# Patient Record
Sex: Female | Born: 1935 | Race: Black or African American | Hispanic: No | Smoking: Never smoker
Health system: Southern US, Community
[De-identification: ages and names within clinical notes are randomized; demographics above are authoritative.]

## PROBLEM LIST (undated history)

## (undated) DIAGNOSIS — E559 Vitamin D deficiency, unspecified: Secondary | ICD-10-CM

## (undated) DIAGNOSIS — I452 Bifascicular block: Secondary | ICD-10-CM

## (undated) DIAGNOSIS — R232 Flushing: Secondary | ICD-10-CM

## (undated) DIAGNOSIS — E785 Hyperlipidemia, unspecified: Secondary | ICD-10-CM

## (undated) DIAGNOSIS — R42 Dizziness and giddiness: Secondary | ICD-10-CM

## (undated) DIAGNOSIS — M8580 Other specified disorders of bone density and structure, unspecified site: Secondary | ICD-10-CM

## (undated) DIAGNOSIS — H269 Unspecified cataract: Secondary | ICD-10-CM

## (undated) DIAGNOSIS — E079 Disorder of thyroid, unspecified: Secondary | ICD-10-CM

## (undated) DIAGNOSIS — I10 Essential (primary) hypertension: Secondary | ICD-10-CM

## (undated) DIAGNOSIS — R918 Other nonspecific abnormal finding of lung field: Secondary | ICD-10-CM

## (undated) DIAGNOSIS — R7309 Other abnormal glucose: Secondary | ICD-10-CM

## (undated) DIAGNOSIS — I351 Nonrheumatic aortic (valve) insufficiency: Secondary | ICD-10-CM

## (undated) DIAGNOSIS — R002 Palpitations: Secondary | ICD-10-CM

## (undated) HISTORY — DX: Flushing: R23.2

## (undated) HISTORY — DX: Vitamin D deficiency, unspecified: E55.9

## (undated) HISTORY — DX: Other abnormal glucose: R73.09

## (undated) HISTORY — DX: Other specified disorders of bone density and structure, unspecified site: M85.80

## (undated) HISTORY — DX: Unspecified cataract: H26.9

## (undated) HISTORY — DX: Other nonspecific abnormal finding of lung field: R91.8

## (undated) HISTORY — DX: Palpitations: R00.2

## (undated) HISTORY — DX: Disorder of thyroid, unspecified: E07.9

## (undated) HISTORY — PX: ABDOMINAL HYSTERECTOMY: SHX81

## (undated) HISTORY — DX: Hyperlipidemia, unspecified: E78.5

## (undated) HISTORY — DX: Dizziness and giddiness: R42

---
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
## 1973-09-14 HISTORY — PX: APPENDECTOMY: SHX54

## 2001-08-14 ENCOUNTER — Emergency Department (HOSPITAL_COMMUNITY): Admission: EM | Admit: 2001-08-14 | Discharge: 2001-08-14 | Payer: Self-pay | Admitting: *Deleted

## 2001-09-14 HISTORY — PX: CHOLECYSTECTOMY: SHX55

## 2003-11-26 ENCOUNTER — Ambulatory Visit (HOSPITAL_COMMUNITY): Admission: RE | Admit: 2003-11-26 | Discharge: 2003-11-26 | Payer: Self-pay | Admitting: Gastroenterology

## 2008-02-09 ENCOUNTER — Emergency Department (HOSPITAL_COMMUNITY): Admission: EM | Admit: 2008-02-09 | Discharge: 2008-02-09 | Payer: Self-pay | Admitting: Emergency Medicine

## 2008-09-14 HISTORY — PX: CATARACT EXTRACTION, BILATERAL: SHX1313

## 2009-05-14 ENCOUNTER — Emergency Department (HOSPITAL_COMMUNITY): Admission: EM | Admit: 2009-05-14 | Discharge: 2009-05-14 | Payer: Self-pay | Admitting: Emergency Medicine

## 2009-07-17 ENCOUNTER — Encounter: Admission: RE | Admit: 2009-07-17 | Discharge: 2009-07-17 | Payer: Self-pay | Admitting: Internal Medicine

## 2009-07-21 ENCOUNTER — Emergency Department (HOSPITAL_COMMUNITY): Admission: EM | Admit: 2009-07-21 | Discharge: 2009-07-21 | Payer: Self-pay | Admitting: Emergency Medicine

## 2009-11-11 ENCOUNTER — Encounter: Admission: RE | Admit: 2009-11-11 | Discharge: 2009-12-05 | Payer: Self-pay | Admitting: Internal Medicine

## 2010-09-30 IMAGING — CT CT HEAD W/O CM
1 of 2 series · 13 of 30 positions shown, 17 images · non-contrast
Comparison: None.

CLINICAL DATA: Dizziness, nausea, generalized weakness, headache

CT HEAD WITHOUT CONTRAST
TECHNIQUE: Contiguous axial images were obtained from the base of
the skull through the vertex without contrast.

[Series 2: brain · axial · 0.47mm/px · z∈[+202,+331]mm · 13 of 28 slices shown, 17 images]
[im 2/28  brain]
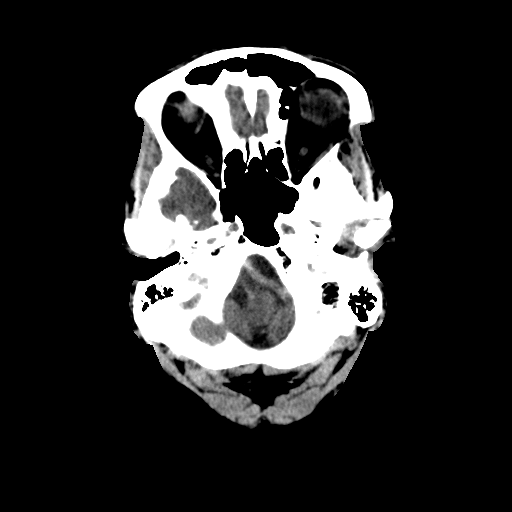
[im 2/28  bone]
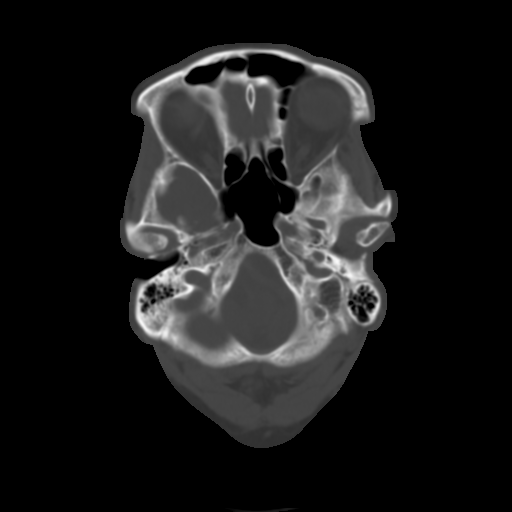
[im 4/28  brain]
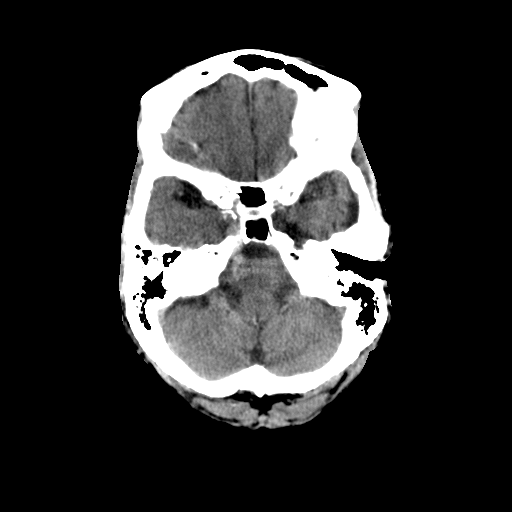
[im 6/28  brain]
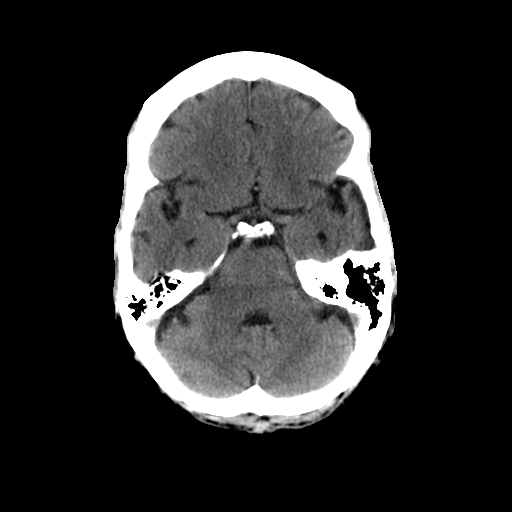
[im 8/28  brain]
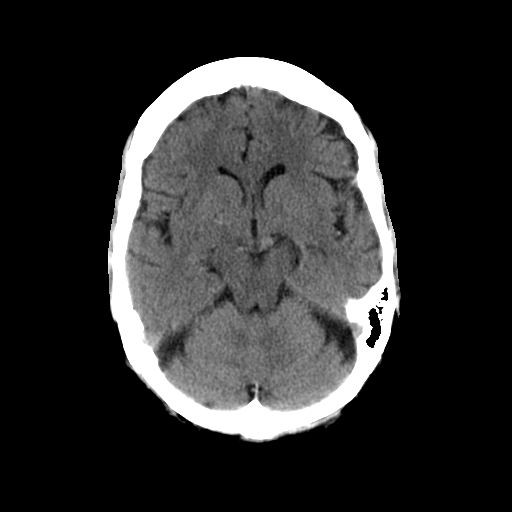
[im 10/28  brain]
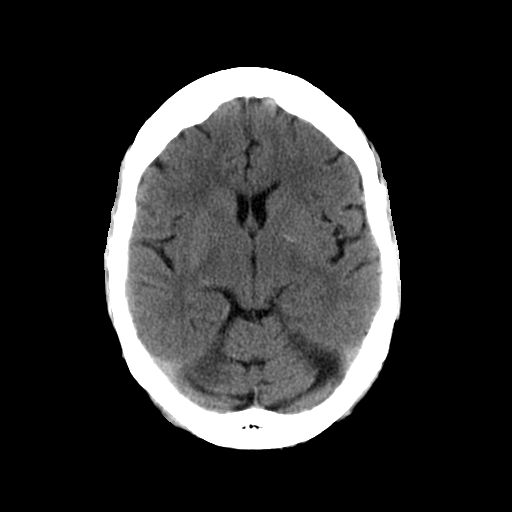
[im 10/28  bone]
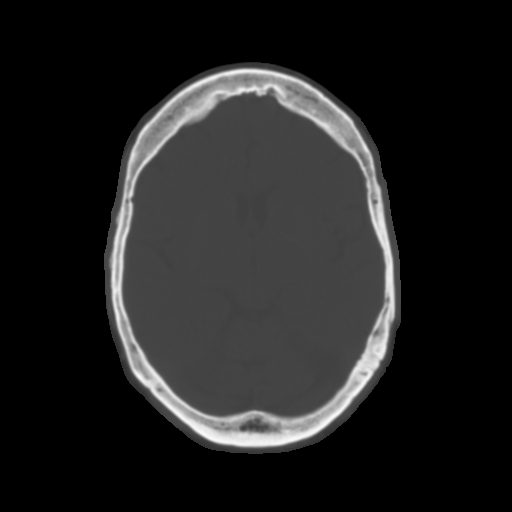
[im 12/28  brain]
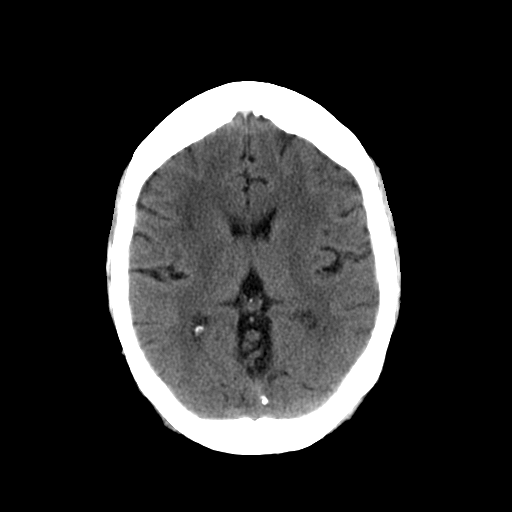
[im 14/28  brain]
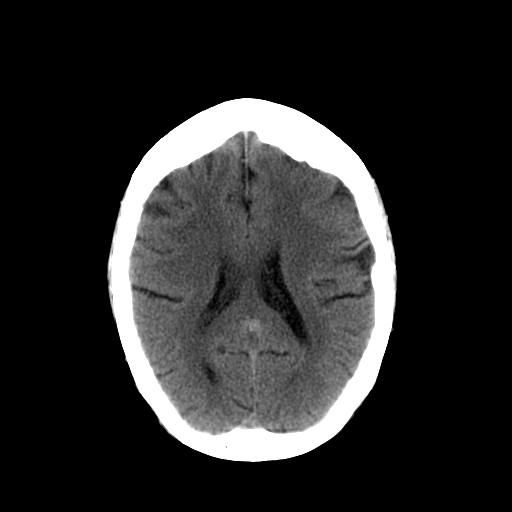
[im 16/28  brain]
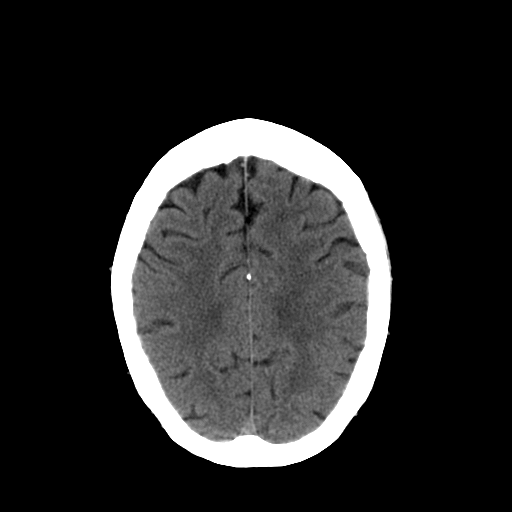
[im 18/28  brain]
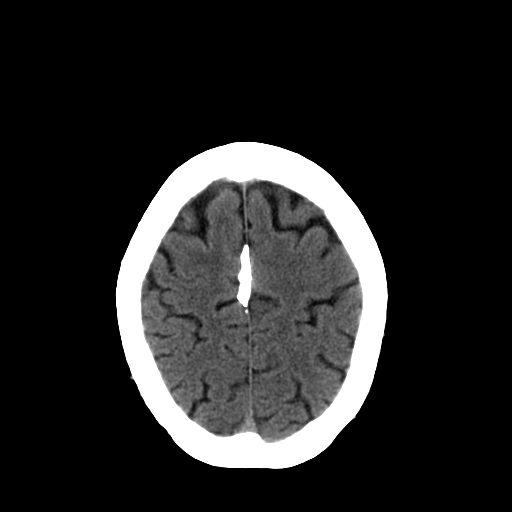
[im 18/28  bone]
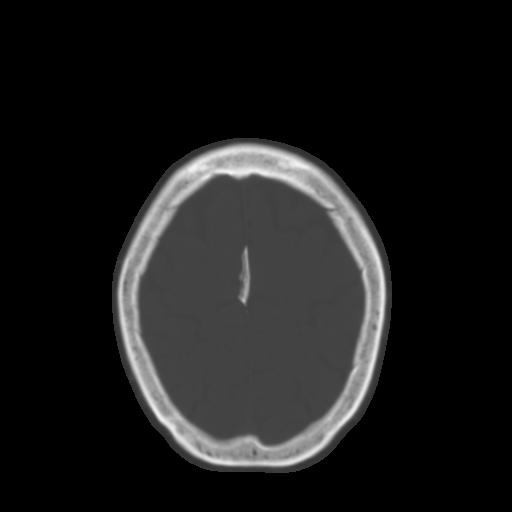
[im 20/28  brain]
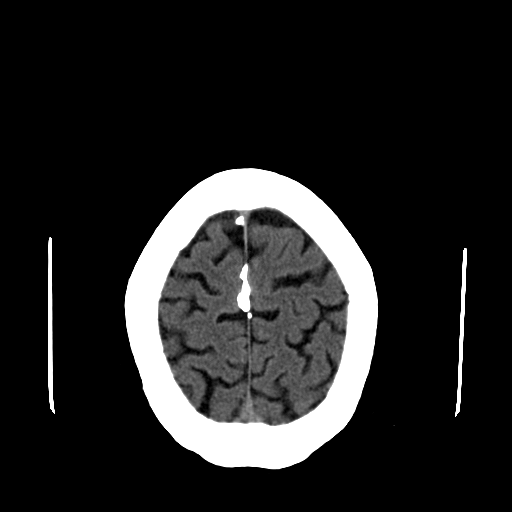
[im 22/28  brain]
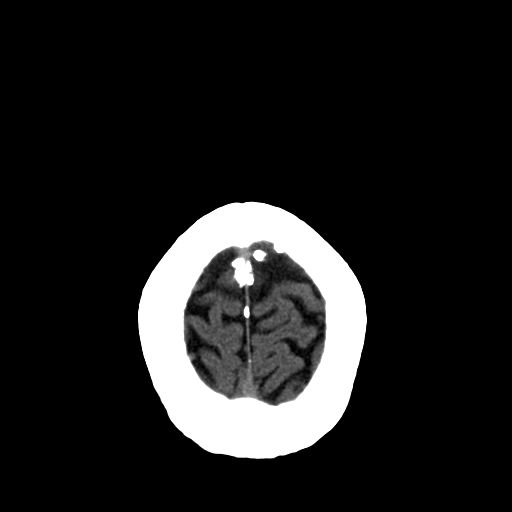
[im 24/28  brain]
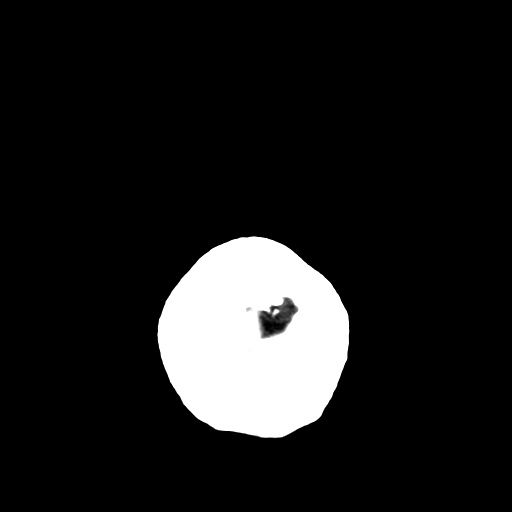
[im 26/28  brain]
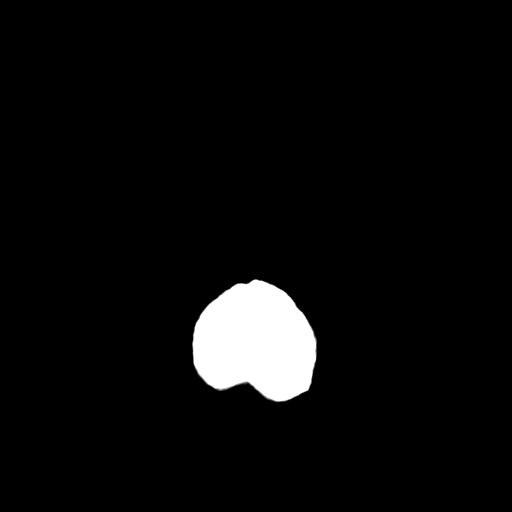
[im 26/28  bone]
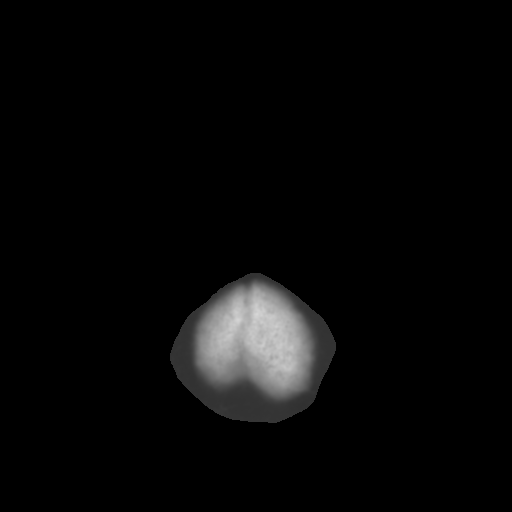

[13 of 30 positions shown; findings below may reference images not displayed]

FINDINGS: There is diffuse brain atrophy with microvascular
ischemic changes in the subcortical and periventricular white
matter.  No acute intracranial hemorrhage, mass lesion, definite
infarction, focal edema, hydrocephalus, midline shift, herniation,
or extra-axial fluid collection.  The gray and white matter
differentiation is maintained.  Cisterns patent.  Atherosclerosis
of the intracranial vessels.  Vertebral basilar system is markedly
tortuous.  Mastoids and sinuses visualized are clear.
IMPRESSION: Atrophy and microvascular ischemic changes.
No acute finding by noncontrast CT.

## 2010-12-20 LAB — DIFFERENTIAL
Basophils Absolute: 0 10*3/uL (ref 0.0–0.1)
Lymphocytes Relative: 32 % (ref 12–46)
Lymphs Abs: 2.3 10*3/uL (ref 0.7–4.0)
Neutro Abs: 4.1 10*3/uL (ref 1.7–7.7)
Neutrophils Relative %: 58 % (ref 43–77)

## 2010-12-20 LAB — CBC
Platelets: 153 10*3/uL (ref 150–400)
RDW: 14.3 % (ref 11.5–15.5)
WBC: 7.1 10*3/uL (ref 4.0–10.5)

## 2010-12-20 LAB — POCT I-STAT, CHEM 8
BUN: 20 mg/dL (ref 6–23)
Creatinine, Ser: 0.8 mg/dL (ref 0.4–1.2)
Hemoglobin: 17.3 g/dL — ABNORMAL HIGH (ref 12.0–15.0)
Potassium: 3.3 mEq/L — ABNORMAL LOW (ref 3.5–5.1)
Sodium: 142 mEq/L (ref 135–145)

## 2011-01-30 NOTE — Op Note (Signed)
Sonya Gross, Sonya Gross                          ACCOUNT NO.:  [id number]   MEDICAL RECORD NO.:  [id number]                   PATIENT TYPE:  AMB   LOCATION:  ENDO                                 FACILITY:  [facility]   PHYSICIAN:  Anselmo Rod, M.D.               DATE OF BIRTH:  Apr 21, 1930   DATE OF PROCEDURE:  11/26/2003  DATE OF DISCHARGE:                                 OPERATIVE REPORT   PROCEDURE PERFORMED:  Screening colonoscopy.   ENDOSCOPIST:  Anselmo Rod, M.D.   INSTRUMENT USED:  Olympus video colonoscope.   INDICATION FOR PROCEDURE:  A 77 year old African-American female undergoing  screening colonoscopy to rule out colonic polyps, masses, etc.   PREPROCEDURE PREPARATION:  Informed consent was procured from the patient.  The patient was fasted for eight hours prior to the procedure and prepped  with a bottle of magnesium citrate and a gallon of GoLYTELY the night prior  to the procedure.   PREPROCEDURE PHYSICAL:  VITAL SIGNS:  The patient had stable vital signs.  NECK:  Supple.  CHEST:  Clear to auscultation.  S1, S2 regular.  ABDOMEN:  Soft with normal bowel sounds.   DESCRIPTION OF PROCEDURE:  The patient was placed in the left lateral  decubitus position and sedated with 70 mg of Demerol and 6 mg of Versed  intravenously.  Once the patient was adequately sedate and maintained on low-  flow oxygen and continuous cardiac monitoring, the Olympus video colonoscope  was advanced from the rectum to the cecum.  The appendiceal orifice and  ileocecal valve were clearly visualized and photographed.  A few scattered  diverticula were seen.  Small internal hemorrhoids were appreciated on  retroflexion in the rectum.  The patient tolerated the procedure well  without complications.   IMPRESSION:  1. Small internal hemorrhoids.  2. Few scattered diverticula.  3. No masses or polyps seen.   RECOMMENDATIONS:  1. A high-fiber diet has been recommended with liberal fluid  intake.  2. Repeat CRC screening in the next 10 years unless the patient develops any     abnormal symptoms in the interim.  3. Outpatient follow-up as the need arises in the future.                                               Anselmo Rod, M.D.    JNM/MEDQ  D:  11/26/2003  T:  11/26/2003  Job:  [id number]   cc:   Merlene Laughter. Renae Gloss, M.D.  [address]  [address]  [place]  [place] [address]  Fax: [phone]

## 2011-06-10 LAB — COMPREHENSIVE METABOLIC PANEL
ALT: 28
AST: 23
CO2: 28
Calcium: 9.5
Chloride: 100
GFR calc Af Amer: >60
GFR calc non Af Amer: >60
Glucose, Bld: 154 — ABNORMAL HIGH
Sodium: 138
Total Bilirubin: 0.7

## 2011-06-10 LAB — CBC
Hemoglobin: 14
MCHC: 33.7
MCV: 88.5
RBC: 4.69
WBC: 7

## 2011-06-10 LAB — DIFFERENTIAL
Basophils Absolute: 0
Basophils Relative: 0
Eosinophils Absolute: 0.1
Eosinophils Relative: 1
Lymphs Abs: 0.9
Neutrophils Relative %: 76

## 2013-09-15 ENCOUNTER — Ambulatory Visit (INDEPENDENT_AMBULATORY_CARE_PROVIDER_SITE_OTHER): Payer: Medicare PPO | Admitting: Family Medicine

## 2013-09-15 ENCOUNTER — Ambulatory Visit: Payer: BC Managed Care – PPO

## 2013-09-15 VITALS — BP 124/70 | HR 89 | Temp 98.2°F | Resp 18 | Ht 64.5 in | Wt 146.0 lb

## 2013-09-15 DIAGNOSIS — R9389 Abnormal findings on diagnostic imaging of other specified body structures: Secondary | ICD-10-CM

## 2013-09-15 DIAGNOSIS — R6883 Chills (without fever): Secondary | ICD-10-CM

## 2013-09-15 DIAGNOSIS — R05 Cough: Secondary | ICD-10-CM

## 2013-09-15 DIAGNOSIS — R059 Cough, unspecified: Secondary | ICD-10-CM

## 2013-09-15 MED ORDER — DOXYCYCLINE HYCLATE 100 MG PO TABS: 100.0000 mg | ORAL_TABLET | Freq: Two times a day (BID) | ORAL | Status: DC

## 2013-09-15 MED ORDER — BENZONATATE 100 MG PO CAPS: 100.0000 mg | ORAL_CAPSULE | Freq: Three times a day (TID) | ORAL | Status: DC | PRN

## 2013-09-15 NOTE — Patient Instructions (Signed)
Your chest x-ray looks ok to me.  We are going to treat you for bronchitis with an antibiotic called doxycycline and tessalon perles (as needed for cough).    Please let me know if you are not feeling better in the next few days- Sooner if worse.

## 2013-09-15 NOTE — Progress Notes (Addendum)
[facility] [address], [place] [id number] [phone]  Date:  09/15/2013   Name:  Sonya Gross   DOB:  Apr 05, 1930   MRN:  [id number]  PCP:  No primary provider on file.    Chief Complaint: Cough and Chills   History of Present Illness:  Sonya Gross is a 80 y.o. very pleasant female patient who presents with the following:  Pt is complaining of a chronic productive cough with mild phlegm that has been progressively worsening for the past 1 week ago. She is also complaining of associated sinus pressure. Pt reports having no sick contacts. She denies emesis, diarrhea, nausea, abdominal pain, fatigue, and fever. Otherwise pt is generally a healthy woman. She is treated for HTN and high cholesterol per her PCP Dr. Renae GlossShelton  Here today with her husband  There are no active problems to display for this patient.   Past Medical History  Diagnosis Date   Cataract    Hyperlipidemia     Past Surgical History  Procedure Laterality Date   Abdominal hysterectomy      History  Substance Use Topics   Smoking status: Never Smoker    Smokeless tobacco: Not on file   Alcohol Use: Not on file    Family History  Problem Relation Age of Onset   Cancer Mother    Mental illness Sister     Allergies  Allergen Reactions   Penicillins     Medication list has been reviewed and updated.  No current outpatient prescriptions on file prior to visit.   No current facility-administered medications on file prior to visit.    Review of Systems:  As per HPI, otherwise negative.  Physical Examination: Filed Vitals:   09/15/13 1142  BP: 124/70  Pulse: 89  Temp: 98.2 F (36.8 C)  Resp: 18    Body mass index is 24.68 kg/(m^2).   GEN: WDWN, NAD, Non-toxic, A & O x 3. Pt looks well. Spry for her age HEENT: Atraumatic, Normocephalic. Neck supple. No masses, No LAD.  Bilateral TM wnl, oropharynx normal.  PEERL,EOMI.   Ears and Nose: No  external deformity. CV: RRR, No M/G/R. No JVD. No thrill. No extra heart sounds. PULM: CTA B, no wheezes, crackles, rhonchi. No retractions. No resp. distress. No accessory muscle use. ABD: S, NT, ND EXTR: No c/c/e NEURO Normal gait.  PSYCH: Normally interactive. Conversant. Not depressed or anxious appearing.  Calm demeanor.   Results for orders placed during the hospital encounter of 05/14/09  CBC      Result Value Range   WBC 7.1  4.0 - 10.5 K/uL   RBC 4.71  3.87 - 5.11 MIL/uL   Hemoglobin 14.4  12.0 - 15.0 g/dL   HCT 29.542.7  62.136.0 - 30.846.0 %   MCV 90.7  78.0 - 100.0 fL   MCHC 33.8  30.0 - 36.0 g/dL   RDW 65.714.3  84.611.5 - 96.215.5 %   Platelets 153  150 - 400 K/uL  DIFFERENTIAL      Result Value Range   Neutrophils Relative % 58  43 - 77 %   Neutro Abs 4.1  1.7 - 7.7 K/uL   Lymphocytes Relative 32  12 - 46 %   Lymphs Abs 2.3  0.7 - 4.0 K/uL   Monocytes Relative 8  3 - 12 %   Monocytes Absolute 0.5  0.1 - 1.0 K/uL   Eosinophils Relative 2  0 - 5 %  Eosinophils Absolute 0.1  0.0 - 0.7 K/uL   Basophils Relative 1  0 - 1 %   Basophils Absolute 0.0  0.0 - 0.1 K/uL  POCT I-STAT, CHEM 8      Result Value Range   Sodium 142  135 - 145 mEq/L   Potassium 3.3 (*) 3.5 - 5.1 mEq/L   Chloride 108  96 - 112 mEq/L   BUN 20  6 - 23 mg/dL   Creatinine, Ser 0.8  0.4 - 1.2 mg/dL   Glucose, Bld 161 (*) 70 - 99 mg/dL   Calcium, Ion 0.96 (*) 1.12 - 1.32 mmol/L   TCO2 24  0 - 100 mmol/L   Hemoglobin 17.3 (*) 12.0 - 15.0 g/dL   HCT 04.5 (*) 40.9 - 81.1 %   [facility] reading (PRIMARY) by  Dr. Patsy Lager. CXR: negative  Assessment and Plan: Cough - Plan: DG Chest 2 View, doxycycline (VIBRA-TABS) 100 MG tablet, benzonatate (TESSALON) 100 MG capsule  Chills  Treat for bronchitis as above.  Asked her to please let me know it not better soon- Sooner if worse.    Signed Abbe Amsterdam, MD

## 2013-09-18 NOTE — Progress Notes (Unsigned)
Pt has been called regarding her recent CXR and is ok with doing a CT for follow up of possible nodule.  Will order for her  CHEST 2 VIEW  COMPARISON: 05/14/2009  FINDINGS:  There is a 7.9 mm right upper lobe nodular opacity. There is no  focal consolidation, pleural effusion or pneumothorax. Normal  cardiomediastinal silhouette. There is thoracic spine spondylosis.  IMPRESSION:  7.9 mm right upper lobe nodular opacity which may represent a  dilated vessel versus a true pulmonary nodule. Recommend further  evaluation with a dedicated CT of the chest.

## 2013-09-19 ENCOUNTER — Telehealth: Payer: Self-pay | Admitting: Radiology

## 2013-09-19 NOTE — Telephone Encounter (Signed)
Message copied by Caffie DammeLITTRELL, AMY W on Tue Sep 19, 2013 10:10 AM ------      Message from: Abbe AmsterdamOPLAND, JESSICA C      Created: Mon Sep 18, 2013  8:57 PM       I ordered the CT chest (with contrast) as recommended by radiology.  Can we please get this scheduled for her?        Thanks!      She may also need BUN/Creat prior ------

## 2013-09-19 NOTE — Telephone Encounter (Signed)
I am working on this, but it will take up to 7days for the authorization. This patient has [employer] medicare and all referrals / studies/ orders everything has to be faxed to [employer] insurance company for authorization.

## 2013-10-11 ENCOUNTER — Ambulatory Visit
Admission: RE | Admit: 2013-10-11 | Discharge: 2013-10-11 | Disposition: A | Discharge status: Home / Self Care | Payer: Medicare PPO | Source: Ambulatory Visit | Attending: Family Medicine | Admitting: Family Medicine

## 2013-10-11 DIAGNOSIS — R059 Cough, unspecified: Secondary | ICD-10-CM

## 2013-10-11 DIAGNOSIS — R05 Cough: Secondary | ICD-10-CM

## 2013-10-11 DIAGNOSIS — R9389 Abnormal findings on diagnostic imaging of other specified body structures: Secondary | ICD-10-CM

## 2013-10-11 MED ORDER — IOHEXOL 300 MG/ML  SOLN
75.0000 mL | Freq: Once | INTRAMUSCULAR | Status: AC | PRN
  Administered 2013-10-11: 75 mL via INTRAVENOUS

## 2013-10-13 ENCOUNTER — Telehealth: Payer: Self-pay | Admitting: Family Medicine

## 2013-10-13 ENCOUNTER — Encounter: Payer: Self-pay | Admitting: Family Medicine

## 2013-10-13 DIAGNOSIS — R059 Cough, unspecified: Secondary | ICD-10-CM

## 2013-10-13 DIAGNOSIS — R05 Cough: Secondary | ICD-10-CM

## 2013-10-13 DIAGNOSIS — R911 Solitary pulmonary nodule: Secondary | ICD-10-CM

## 2013-10-13 MED ORDER — BENZONATATE 100 MG PO CAPS: 100.0000 mg | ORAL_CAPSULE | Freq: Three times a day (TID) | ORAL | Status: DC | PRN

## 2013-10-13 NOTE — Telephone Encounter (Signed)
Called to go over her chest CT.  There are a few nodules, none of which necessitate a PET scan but follow-up is recommended.  She reports that she has never smoked or been exposed to any chemicals such as asbestos or silicone as far as she knows.  Therefore recommended that we recheck a CT scan in about 6 months.  I will order this CT for her.  She also requests a refill of tessalon perles which is fine  CT CHEST WITH CONTRAST  TECHNIQUE:  Multidetector CT imaging of the chest was performed during  intravenous contrast administration.  CONTRAST: 75mL OMNIPAQUE IOHEXOL 300 MG/ML SOLN  COMPARISON: Chest x-ray 09/15/2013  FINDINGS:  There is a nodular density noted posteriorly along the major fissure  within the right upper lobe. This measures 8 mm and has a slightly  spiculated appearance and appears to cause mild retraction of the  major fissure. Multiple other right lung nodules. 6 mm nodule in the  right lower lobe on image 31. 5 mm nodule in the right middle lobe  on image 36. Sub pleural 7 mm nodule in the right middle lobe on  image 40. Flat 8 mm nodule along the minor fissure in the right  middle lobe on image 31. 5 mm nodule in the left lower lobe on image  40 for along with an 8 mm left lower lobe nodule on image 34. Also  in the left lung base just above the left hemidiaphragm is an 8 mm  nodule on image 44.  Areas of biapical scarring noted. No pleural effusions.  Heart is normal size. Aorta is normal caliber. No mediastinal,  hilar, or axillary adenopathy. Chest wall soft tissues are  unremarkable.  Degenerative changes throughout the thoracic spine. No acute bony  abnormality. Imaging into the upper abdomen shows no acute findings.  Prior cholecystectomy.  IMPRESSION:  There are multiple bilateral pulmonary nodules. There is an 8 mm  slightly spiculated nodule in the right upper lobe along the major  fissure which likely corresponds to the density seen on chest x-ray.  The  largest of these nodules (8 mm bilaterally) are borderline large  enough to evaluate with PET CT. The others are too small to evaluate  with PET CT. Therefore, I would recommend close CT surveillance. If  the patient is at high risk for bronchogenic carcinoma, follow-up  chest CT at 3-636months is recommended. If the patient is at low risk  for bronchogenic carcinoma, follow-up chest CT at 6-12 months is  recommended. This recommendation follows the consensus statement:  Guidelines for Management of Small Pulmonary Nodules Detected on CT  Scans: A Statement from the Fleischner Society as published in  Radiology 2005; 237:395-400.

## 2014-02-27 ENCOUNTER — Other Ambulatory Visit: Payer: Self-pay | Admitting: Internal Medicine

## 2014-02-27 DIAGNOSIS — R918 Other nonspecific abnormal finding of lung field: Secondary | ICD-10-CM

## 2014-03-02 ENCOUNTER — Ambulatory Visit
Admission: RE | Admit: 2014-03-02 | Discharge: 2014-03-02 | Disposition: A | Discharge status: Home / Self Care | Payer: Medicare PPO | Source: Ambulatory Visit | Attending: Internal Medicine | Admitting: Internal Medicine

## 2014-03-02 DIAGNOSIS — R918 Other nonspecific abnormal finding of lung field: Secondary | ICD-10-CM

## 2014-03-02 MED ORDER — IOHEXOL 300 MG/ML  SOLN
75.0000 mL | Freq: Once | INTRAMUSCULAR | Status: AC | PRN
  Administered 2014-03-02: 75 mL via INTRAVENOUS

## 2014-10-12 ENCOUNTER — Telehealth: Payer: Self-pay | Admitting: Radiology

## 2014-10-12 DIAGNOSIS — R9389 Abnormal findings on diagnostic imaging of other specified body structures: Secondary | ICD-10-CM

## 2014-10-12 NOTE — Telephone Encounter (Signed)
Dr Patsy Lageropland- patient is going in for her follow up chest CT on Monday.  [facility] needs a BUN/Cr.  May I order this and request patient come in for lab only?

## 2014-10-12 NOTE — Telephone Encounter (Signed)
Sure, please do.  Thank you!

## 2014-10-13 NOTE — Telephone Encounter (Signed)
LMOM to CB BUN/Cr future ordered.

## 2014-10-15 ENCOUNTER — Inpatient Hospital Stay: Admission: RE | Admit: 2014-10-15 | Payer: Medicare PPO | Source: Ambulatory Visit

## 2014-10-15 NOTE — Telephone Encounter (Signed)
Relayed message to patient.  She will come in for the labs.

## 2014-10-16 ENCOUNTER — Other Ambulatory Visit: Payer: Self-pay | Admitting: Family Medicine

## 2014-10-16 NOTE — Telephone Encounter (Signed)
[facility] lab called. Pt is there to get her blood drawn. Faxed over order. Will call [facility] to reschedule CT

## 2014-10-17 ENCOUNTER — Other Ambulatory Visit: Payer: Self-pay | Admitting: Family Medicine

## 2014-10-17 ENCOUNTER — Ambulatory Visit
Admission: RE | Admit: 2014-10-17 | Discharge: 2014-10-17 | Disposition: A | Discharge status: Home / Self Care | Payer: Medicare PPO | Source: Ambulatory Visit | Attending: Family Medicine | Admitting: Family Medicine

## 2014-10-17 DIAGNOSIS — R911 Solitary pulmonary nodule: Secondary | ICD-10-CM

## 2014-10-18 ENCOUNTER — Telehealth: Payer: Self-pay | Admitting: Family Medicine

## 2014-10-18 DIAGNOSIS — R918 Other nonspecific abnormal finding of lung field: Secondary | ICD-10-CM

## 2014-10-18 LAB — COMPREHENSIVE METABOLIC PANEL
ALK PHOS: 56 U/L (ref 39–117)
ALT: 13 U/L (ref 0–35)
AST: 16 U/L (ref 0–37)
Albumin: 3.9 g/dL (ref 3.5–5.2)
BUN: 14 mg/dL (ref 6–23)
CHLORIDE: 104 meq/L (ref 96–112)
CO2: 29 mEq/L (ref 19–32)
Calcium: 10.3 mg/dL (ref 8.4–10.5)
Creat: 0.78 mg/dL (ref 0.50–1.10)
GLUCOSE: 104 mg/dL — AB (ref 70–99)
Potassium: 4.5 mEq/L (ref 3.5–5.3)
Sodium: 142 mEq/L (ref 135–145)
TOTAL PROTEIN: 7 g/dL (ref 6.0–8.3)
Total Bilirubin: 0.6 mg/dL (ref 0.2–1.2)

## 2014-10-18 NOTE — Telephone Encounter (Signed)
Called but phone went to machine and then disconnected.  Will send her a letter, order CT for one year from now.

## 2014-10-19 ENCOUNTER — Encounter: Payer: Self-pay | Admitting: Radiology

## 2015-05-15 ENCOUNTER — Ambulatory Visit (INDEPENDENT_AMBULATORY_CARE_PROVIDER_SITE_OTHER): Payer: Medicare PPO | Admitting: Physician Assistant

## 2015-05-15 VITALS — BP 124/64 | HR 89 | Temp 98.9°F | Resp 14 | Ht 65.0 in | Wt 135.8 lb

## 2015-05-15 DIAGNOSIS — R300 Dysuria: Secondary | ICD-10-CM

## 2015-05-15 DIAGNOSIS — N3001 Acute cystitis with hematuria: Secondary | ICD-10-CM

## 2015-05-15 LAB — POCT UA - MICROSCOPIC ONLY
CASTS, UR, LPF, POC: NEGATIVE
Crystals, Ur, HPF, POC: NEGATIVE
MUCUS UA: POSITIVE
Yeast, UA: NEGATIVE

## 2015-05-15 LAB — POCT URINALYSIS DIPSTICK
BILIRUBIN UA: NEGATIVE
GLUCOSE UA: NEGATIVE
Ketones, UA: NEGATIVE
NITRITE UA: POSITIVE
Protein, UA: >=300
Spec Grav, UA: 1.025
Urobilinogen, UA: 0.2
pH, UA: 5.5

## 2015-05-15 MED ORDER — NITROFURANTOIN MONOHYD MACRO 100 MG PO CAPS: 100.0000 mg | ORAL_CAPSULE | Freq: Two times a day (BID) | ORAL | Status: DC

## 2015-05-15 NOTE — Progress Notes (Signed)
   Subjective:    Patient ID: Sonya Gross, female    DOB: 1930-01-17, 81 y.o.   MRN: [id number]  HPI Patient presents for dysuria that has been present for 1 day along with frequency. This morning had hematuria and lower abdominal pain. Denies fever, chills, N/V. Is not sexually active. Bowel movements unchanged. Med allergy PCN.   Review of Systems  Constitutional: Negative.  Negative for fever and chills.  Gastrointestinal: Negative for nausea, vomiting, abdominal pain, diarrhea and constipation.  Genitourinary: Positive for dysuria, urgency, frequency, hematuria and pelvic pain. Negative for flank pain, decreased urine volume, vaginal bleeding, vaginal discharge, difficulty urinating and vaginal pain.  Musculoskeletal: Negative for back pain.       Objective:   Physical Exam  Constitutional: She is oriented to person, place, and time. She appears well-developed and well-nourished. No distress.  Blood pressure 124/64, pulse 89, temperature 98.9 F (37.2 C), temperature source Oral, resp. rate 14, height  (1.651 m), weight 135 lb 12.8 oz (61.598 kg), SpO2 97 %.  HENT:  Head: Normocephalic and atraumatic.  Right Ear: External ear normal.  Left Ear: External ear normal.  Eyes: Conjunctivae are normal. Right eye exhibits no discharge. Left eye exhibits no discharge. No scleral icterus.  Cardiovascular: Normal rate, regular rhythm and normal heart sounds.  Exam reveals no gallop and no friction rub.   No murmur heard. Pulmonary/Chest: Effort normal and breath sounds normal. No respiratory distress. She has no wheezes. She has no rales.  Abdominal: Soft. Bowel sounds are normal. She exhibits no distension. There is tenderness in the suprapubic area. There is no rigidity, no rebound, no guarding, no CVA tenderness, no tenderness at McBurney's point and negative Murphy's sign. No hernia.  Neurological: She is alert and oriented to person, place, and time.  Skin: Skin is warm and  dry. No rash noted. She is not diaphoretic. No erythema. No pallor.  Psychiatric: She has a normal mood and affect. Her behavior is normal. Judgment and thought content normal.   Results for orders placed or performed in visit on 05/15/15  POCT UA - Microscopic Only  Result Value Ref Range   WBC, Ur, HPF, POC 4-6    RBC, urine, microscopic TNTC    Bacteria, U Microscopic 2+    Mucus, UA positive    Epithelial cells, urine per micros 0-3    Crystals, Ur, HPF, POC neg    Casts, Ur, LPF, POC neg    Yeast, UA neg   POCT urinalysis dipstick  Result Value Ref Range   Color, UA yellow    Clarity, UA cloudy    Glucose, UA neg    Bilirubin, UA neg    Ketones, UA neg    Spec Grav, UA 1.025    Blood, UA large    pH, UA 5.5    Protein, UA >=300    Urobilinogen, UA 0.2    Nitrite, UA positive    Leukocytes, UA small (1+) (A) Negative       Assessment & Plan:  1. Acute cystitis with hematuria RTC when complete antibiotic for repeat UA. - nitrofurantoin, macrocrystal-monohydrate, (MACROBID) 100 MG capsule; Take 1 capsule (100 mg total) by mouth 2 (two) times daily.  Dispense: 20 capsule; Refill: 0  2. Dysuria - POCT UA - Microscopic Only - POCT urinalysis dipstick   Janan Ridge PA-C  [facility] 05/15/2015 4:01 PM

## 2015-05-15 NOTE — Patient Instructions (Signed)

## 2015-12-05 ENCOUNTER — Ambulatory Visit (INDEPENDENT_AMBULATORY_CARE_PROVIDER_SITE_OTHER): Payer: Medicare Other | Admitting: Physician Assistant

## 2015-12-05 VITALS — BP 128/76 | HR 106 | Temp 102.5°F | Resp 16 | Ht 65.0 in | Wt 135.0 lb

## 2015-12-05 DIAGNOSIS — R6889 Other general symptoms and signs: Secondary | ICD-10-CM

## 2015-12-05 DIAGNOSIS — R509 Fever, unspecified: Secondary | ICD-10-CM | POA: Diagnosis not present

## 2015-12-05 DIAGNOSIS — E785 Hyperlipidemia, unspecified: Secondary | ICD-10-CM | POA: Diagnosis not present

## 2015-12-05 MED ORDER — GUAIFENESIN ER 600 MG PO TB12: 600.0000 mg | ORAL_TABLET | Freq: Two times a day (BID) | ORAL | Status: DC

## 2015-12-05 MED ORDER — OSELTAMIVIR PHOSPHATE 75 MG PO CAPS: 75.0000 mg | ORAL_CAPSULE | Freq: Two times a day (BID) | ORAL | Status: DC

## 2015-12-05 MED ORDER — ACETAMINOPHEN 325 MG PO TABS
1000.0000 mg | ORAL_TABLET | Freq: Once | ORAL | Status: AC
  Administered 2015-12-05: 975 mg via ORAL

## 2015-12-05 MED ORDER — BENZONATATE 100 MG PO CAPS: 100.0000 mg | ORAL_CAPSULE | Freq: Two times a day (BID) | ORAL | Status: DC | PRN

## 2015-12-05 NOTE — Progress Notes (Signed)
Subjective:     Patient ID: Sonya Gross, female   DOB: Oct 06, 1929, 82 y.o.   MRN: [id number]  HPI  The patient is an 82 year old female with PMH of HLD, HTN, and hypothyroidism who presents with three days of generalized body aches, chills, fatigue and malaise. She has been taking theraflu with minimal relief, also endorses some clear rhinorrhea and nasal congestion. She has a productive cough with green mucus. Denies shortness of breath and chest tightness. Of note, she has not had the pneumococcal or influenza vaccination this year.No ear fullness or pain, no sore throat or trouble swallowing.   Review of Systems All pertinent ROS are as above in HPI    Objective: Filed Vitals:   12/05/15 1650  BP: 128/76  Pulse: 106  Temp: 102.5 F (39.2 C)  Resp: 16      Physical Exam  Constitutional: She appears well-developed and well-nourished. No distress.  HENT:  Head: Normocephalic and atraumatic.  Right Ear: External ear normal.  Left Ear: External ear normal.  Mouth/Throat: Oropharynx is clear and moist.  Erythematous nasal turbinates with swelling  Neck: Neck supple.  Cardiovascular: Normal rate, regular rhythm, normal heart sounds and intact distal pulses.  Exam reveals no gallop and no friction rub.   No murmur heard. Pulmonary/Chest: Breath sounds normal. No respiratory distress. She has no wheezes. She has no rales. She exhibits no tenderness.  Lymphadenopathy:    She has cervical adenopathy.  Skin: Skin is warm and dry.       Assessment:     The patient is a 82 year old female non-smoker who presents with 3 days of generalized fatigue body chills and aches, likely influenza. Symptoms not concernign for pneumonia at this time, lungs clear to auscultation. She technically is outside the flu window time for Tamiflu, however she presents with flu like symptoms will treat accordingly.     Plan:     1. Fever, unspecified/Flu-like symptoms - acetaminophen (TYLENOL) tablet  975 mg; Take 3 tablets (975 mg total) by mouth once. - benzonatate (TESSALON) 100 MG capsule; Take 1 capsule (100 mg total) by mouth 2 (two) times daily as needed for cough.  Dispense: 20 capsule; Refill: 0 - guaiFENesin (MUCINEX) 600 MG 12 hr tablet; Take 1 tablet (600 mg total) by mouth 2 (two) times daily.  Dispense: 30 tablet; Refill: 0 - oseltamivir (TAMIFLU) 75 MG capsule; Take 1 capsule (75 mg total) by mouth 2 (two) times daily.  Dispense: 10 capsule; Refill: 0 - increase fluids, hydrate and continue to get rest

## 2015-12-05 NOTE — Progress Notes (Signed)
   Sonya MoorsOdessa R Gross  MRN: [id number] DOB: [birth year]  Subjective:  Pt presents to clinic with three days of generalized body aches, chills, fatigue and malaise. She has been taking theraflu with minimal relief, also endorses some clear rhinorrhea and nasal congestion. She has a productive cough with green mucus. Denies shortness of breath and chest tightness. Of note, she has not had the pneumococcal (several years ago) or influenza vaccination this year. No ear fullness or pain, no sore throat or trouble swallowing.  There are no active problems to display for this patient.   Current Outpatient Prescriptions on File Prior to Visit  Medication Sig Dispense Refill  . FENOFIBRATE PO Take by mouth.    . Levothyroxine Sodium (SYNTHROID PO) Take by mouth.    . simvastatin (ZOCOR) 5 MG tablet Take 5 mg by mouth daily.     No current facility-administered medications on file prior to visit.    Allergies  Allergen Reactions  . Penicillins     Review of Systems Objective:  BP 128/76 mmHg  Pulse 106  Temp(Src) 102.5 F (39.2 C) (Oral)  Resp 16  Ht 5\' 5"  (1.651 m)  Wt 135 lb (61.236 kg)  BMI 22.47 kg/m2  SpO2 93%  Physical Exam  Constitutional: She is oriented to person, place, and time and well-developed, well-nourished, and in no distress.  HENT:  Head: Normocephalic and atraumatic.  Right Ear: Hearing, tympanic membrane, external ear and ear canal normal.  Left Ear: Hearing, tympanic membrane, external ear and ear canal normal.  Nose: Nose normal.  Mouth/Throat: Uvula is midline, oropharynx is clear and moist and mucous membranes are normal.  Eyes: Conjunctivae are normal.  Neck: Normal range of motion.  Cardiovascular: Normal rate, regular rhythm and normal heart sounds.   No murmur heard. Pulmonary/Chest: Effort normal and breath sounds normal.  Neurological: She is alert and oriented to person, place, and time. Gait normal.  Skin: Skin is warm and dry.  Psychiatric:  Mood, memory, affect and judgment normal.  Vitals reviewed.   Assessment and Plan :  Fever, unspecified - Plan: acetaminophen (TYLENOL) tablet 975 mg, benzonatate (TESSALON) 100 MG capsule, guaiFENesin (MUCINEX) 600 MG 12 hr tablet, oseltamivir (TAMIFLU) 75 MG capsule  Flu-like symptoms   Symptomatic treatment along with the above.  Due to age and likely flu even with her at the end of there treatment window we will start Tamiflu.  Benny LennertSarah Weber PA-C  [facility] 12/05/2015 6:32 PM

## 2015-12-05 NOTE — Patient Instructions (Addendum)
  Please push fluids.  Tylenol and Motrin for fever and body aches.    A humidifier can help especially when the air is dry -if you do not have a humidifier you can boil a pot of water on the stove in your home to help with the dry air.    IF you received an x-ray today, you will receive an invoice from [facility]. Please contact [facility] at [phone] with questions or concerns regarding your invoice.   IF you received labwork today, you will receive an invoice from [facility]. Please contact [facility] at [phone] with questions or concerns regarding your invoice.   Our billing staff will not be able to assist you with questions regarding bills from these companies.  You will be contacted with the lab results as soon as they are available. The fastest way to get your results is to activate your My Chart account. Instructions are located on the last page of this paperwork. If you have not heard from us regarding the results in 2 weeks, please contact this office.      

## 2015-12-23 ENCOUNTER — Encounter: Payer: Self-pay | Admitting: Family Medicine

## 2016-01-02 ENCOUNTER — Telehealth: Payer: Self-pay | Admitting: Family Medicine

## 2016-01-02 DIAGNOSIS — R918 Other nonspecific abnormal finding of lung field: Secondary | ICD-10-CM

## 2016-01-02 NOTE — Telephone Encounter (Signed)
Pt called in because she says that provider usually have her complete a cat scan every year. Pt says that she didn't have one last year. Pt would like to have one this year if possible.   Please advise for scheduling.   CB: [phone]

## 2016-01-03 NOTE — Telephone Encounter (Signed)
Ordered CT chest for her- this has been done to follow-up lung nodules.  CT done 2/16, did recommend a follow-up in one year

## 2016-01-20 ENCOUNTER — Ambulatory Visit
Admission: RE | Admit: 2016-01-20 | Discharge: 2016-01-20 | Disposition: A | Discharge status: Home / Self Care | Payer: Medicare Other | Source: Ambulatory Visit | Attending: Family Medicine | Admitting: Family Medicine

## 2016-01-20 DIAGNOSIS — R918 Other nonspecific abnormal finding of lung field: Secondary | ICD-10-CM

## 2016-01-23 ENCOUNTER — Telehealth: Payer: Self-pay | Admitting: Family Medicine

## 2016-01-23 ENCOUNTER — Encounter: Payer: Self-pay | Admitting: Family Medicine

## 2016-01-23 DIAGNOSIS — N289 Disorder of kidney and ureter, unspecified: Secondary | ICD-10-CM

## 2016-01-23 NOTE — Telephone Encounter (Signed)
Called her- her CT showed stable lung nodules and no further CT scans are needed.  However, we do need to do an US of her left kidney.  She understands and I will schedule for her asap  Ct Chest Wo Contrast  01/20/2016  CLINICAL DATA:  Follow-up pulmonary nodule. EXAM: CT CHEST WITHOUT CONTRAST TECHNIQUE: Multidetector CT imaging of the chest was performed following the standard protocol without IV contrast. COMPARISON:  The o'clock o'clock CT scans dated 10/17/2014, 05/02/2014 and 10/11/2013 2 back FINDINGS: Mediastinum/Lymph Nodes: No masses or pathologically enlarged lymph nodes identified on this un-enhanced exam. Calcification in the arch of the aorta and in the coronary arteries. Heart size is normal. Small hiatal hernia. Multiple nodules are seen in both lobes of the thyroid gland, the largest being 12 mm in diameter in the midportion of the right lobe. Lungs/Pleura: There are multiple bilateral pulmonary nodules which are all stable since the exam of 10/11/2013. There are no new pulmonary nodules. Chronic bilateral apical pleural thickening, stable. No effusions or infiltrates. Upper abdomen: The kidneys are incompletely visualized. The midportion of the left kidney appears slightly enlarged without fat in the renal hilum at that site. The possibility of a mass in the lower pole of the left kidney should be considered. I recommend renal ultrasound for further evaluation. This may be an anatomic variant of the left kidney. Musculoskeletal: Chronic accentuation thoracic kyphosis. Osteophytes fuse multiple levels in the mid thoracic spine. IMPRESSION: 1. Multiple stable pulmonary nodules bilaterally, unchanged since 10/11/2013. No further followup of the pulmonary nodules is recommended. This recommendation follows the consensus statement: Guidelines for Management of Incidental Pulmonary Nodules Detected on CT Images:From the Fleischner Society 2017; published online before print (10.1148/[id number]).  2. Slight prominence of the lower pole of the left kidney. The kidney is incompletely visualized on this chest CT scan. I recommend renal ultrasound to exclude a left renal mass. Electronically Signed   By: Francene BoyersJames  Maxwell M.D.   On: 01/20/2016 10:56

## 2016-01-30 ENCOUNTER — Ambulatory Visit (HOSPITAL_BASED_OUTPATIENT_CLINIC_OR_DEPARTMENT_OTHER)
Admission: RE | Admit: 2016-01-30 | Discharge: 2016-01-30 | Disposition: A | Discharge status: Home / Self Care | Payer: Medicare Other | Source: Ambulatory Visit | Attending: Family Medicine | Admitting: Family Medicine

## 2016-01-30 DIAGNOSIS — N289 Disorder of kidney and ureter, unspecified: Secondary | ICD-10-CM

## 2016-01-30 DIAGNOSIS — R935 Abnormal findings on diagnostic imaging of other abdominal regions, including retroperitoneum: Secondary | ICD-10-CM | POA: Diagnosis present

## 2016-02-03 ENCOUNTER — Encounter: Payer: Self-pay | Admitting: Family Medicine

## 2016-02-26 ENCOUNTER — Ambulatory Visit (INDEPENDENT_AMBULATORY_CARE_PROVIDER_SITE_OTHER): Payer: Medicare Other | Admitting: Internal Medicine

## 2016-02-26 VITALS — BP 116/68 | HR 85 | Temp 98.1°F | Resp 18 | Ht 65.0 in | Wt 137.6 lb

## 2016-02-26 DIAGNOSIS — N390 Urinary tract infection, site not specified: Secondary | ICD-10-CM

## 2016-02-26 DIAGNOSIS — R319 Hematuria, unspecified: Secondary | ICD-10-CM | POA: Diagnosis not present

## 2016-02-26 DIAGNOSIS — E039 Hypothyroidism, unspecified: Secondary | ICD-10-CM | POA: Insufficient documentation

## 2016-02-26 LAB — POCT URINALYSIS DIP (MANUAL ENTRY)
BILIRUBIN UA: NEGATIVE
BILIRUBIN UA: NEGATIVE
GLUCOSE UA: NEGATIVE
NITRITE UA: POSITIVE — AB
Protein Ur, POC: 100 — AB
Urobilinogen, UA: 0.2
pH, UA: 5

## 2016-02-26 LAB — POC MICROSCOPIC URINALYSIS (UMFC): MUCUS RE: ABSENT

## 2016-02-26 MED ORDER — NITROFURANTOIN MONOHYD MACRO 100 MG PO CAPS: 100.0000 mg | ORAL_CAPSULE | Freq: Two times a day (BID) | ORAL | Status: DC

## 2016-02-26 MED ORDER — PHENAZOPYRIDINE HCL 100 MG PO TABS: ORAL_TABLET | ORAL | Status: DC

## 2016-02-26 NOTE — Progress Notes (Signed)
Subjective:  By signing my name below, I, Raven Small, attest that this documentation has been prepared under the direction and in the presence of Ellamae Sia, MD.  Electronically Signed: Andrew Au, ED Scribe. 02/26/2016. 6:31 PM.   Patient ID: Sonya Gross, female    DOB: 1930-07-21, 82 y.o.   MRN: [id number]  HPI Chief Complaint  Patient presents with  . Urinary Tract Infection    blood in urine, first noticed 2-3 hours ago.     HPI Comments: Sonya Gross is a 82 y.o. female who presents to the [facility] complaining of dysuria that began today. Pt states symptoms started last night with abdominal pain. This morning she noticed blood in her urine along with dysuria. She has been treated with macrobid in the past which worked well for her.   Patient Active Problem List   Diagnosis Date Noted  . Hyperlipidemia 12/05/2015   Past Medical History  Diagnosis Date  . Cataract   . Hyperlipidemia    Past Surgical History  Procedure Laterality Date  . Abdominal hysterectomy     Allergies  Allergen Reactions  . Penicillins    Prior to Admission medications   Medication Sig Start Date End Date Taking? Authorizing Provider  FENOFIBRATE PO Take by mouth.   Yes Historical Provider, MD  Levothyroxine Sodium (SYNTHROID PO) Take by mouth.   Yes Historical Provider, MD  simvastatin (ZOCOR) 5 MG tablet Take 5 mg by mouth daily.   Yes Historical Provider, MD  triamcinolone cream (KENALOG) 0.1 % Apply 1 application topically 2 (two) times daily.   Yes Historical Provider, MD  triamterene-hydrochlorothiazide (DYAZIDE) 37.5-25 MG capsule Take 1 capsule by mouth daily.   Yes Historical Provider, MD   Social History   Social History  . Marital Status: Married    Spouse Name: N/A  . Number of Children: N/A  . Years of Education: N/A   Occupational History  . Not on file.   Social History Main Topics  . Smoking status: Never Smoker   . Smokeless  tobacco: Never Used  . Alcohol Use: Not on file  . Drug Use: Not on file  . Sexual Activity: Not on file   Other Topics Concern  . Not on file   Social History Narrative   Review of Systems  Gastrointestinal: Positive for abdominal pain.  Genitourinary: Positive for dysuria and hematuria.   Objective:   Physical Exam  Constitutional: She is oriented to person, place, and time. She appears well-developed and well-nourished. No distress.  HENT:  Head: Normocephalic and atraumatic.  Eyes: Conjunctivae and EOM are normal.  Neck: Neck supple.  Cardiovascular: Normal rate.   Pulmonary/Chest: Effort normal.  Abdominal: Soft. There is no tenderness.  Musculoskeletal: Normal range of motion.  Neurological: She is alert and oriented to person, place, and time.  Skin: Skin is warm and dry.  Psychiatric: She has a normal mood and affect. Her behavior is normal.  Nursing note and vitals reviewed.   Filed Vitals:   02/26/16 1759  BP: 116/68  Pulse: 85  Temp: 98.1 F (36.7 C)  TempSrc: Oral  Resp: 18  Height:  (1.651 m)  Weight: 137 lb 9.6 oz (62.415 kg)  SpO2: 96%   Results for orders placed or performed in visit on 02/26/16  POCT urinalysis dipstick  Result Value Ref Range   Color, UA yellow yellow   Clarity, UA clear clear   Glucose, UA negative negative  Bilirubin, UA negative negative   Ketones, POC UA negative negative   Spec Grav, UA >=1.030    Blood, UA large (A) negative   pH, UA 5.0    Protein Ur, POC =100 (A) negative   Urobilinogen, UA 0.2    Nitrite, UA Positive (A) Negative   Leukocytes, UA moderate (2+) (A) Negative  POCT Microscopic Urinalysis ([facility])  Result Value Ref Range   WBC,UR,HPF,POC Too numerous to count  (A) None WBC/hpf   RBC,UR,HPF,POC Too numerous to count  (A) None RBC/hpf   Bacteria Moderate (A) None, Too numerous to count   Mucus Absent Absent   Epithelial Cells, UR Per Microscopy Moderate (A) None, Too numerous to count cells/hpf      Assessment & Plan:  Blood in urine - Plan: POCT urinalysis dipstick, POCT Microscopic Urinalysis ([facility])  Urinary tract infection, site not specified - Plan: Urine culture  Meds ordered this encounter  Medications  . nitrofurantoin, macrocrystal-monohydrate, (MACROBID) 100 MG capsule    Sig: Take 1 capsule (100 mg total) by mouth 2 (two) times daily.    Dispense:  14 capsule    Refill:  0  . phenazopyridine (PYRIDIUM) 100 MG tablet    Sig: One now and one in am    Dispense:  2 tablet    Refill:  0   I have completed the patient encounter in its entirety as documented by the scribe, with editing by me where necessary. Breyer Tejera P. Merla Richesoolittle, M.D.

## 2016-02-26 NOTE — Patient Instructions (Signed)
     IF you received an x-ray today, you will receive an invoice from [facility]. Please contact [facility] at [phone] with questions or concerns regarding your invoice.   IF you received labwork today, you will receive an invoice from [facility]. Please contact [facility] at [phone] with questions or concerns regarding your invoice.   Our billing staff will not be able to assist you with questions regarding bills from these companies.  You will be contacted with the lab results as soon as they are available. The fastest way to get your results is to activate your My Chart account. Instructions are located on the last page of this paperwork. If you have not heard from us regarding the results in 2 weeks, please contact this office.      

## 2016-03-01 LAB — URINE CULTURE: Colony Count: >=100000

## 2018-12-21 ENCOUNTER — Encounter (HOSPITAL_COMMUNITY): Payer: Self-pay | Admitting: Emergency Medicine

## 2018-12-21 ENCOUNTER — Other Ambulatory Visit: Payer: Self-pay

## 2018-12-21 ENCOUNTER — Emergency Department (HOSPITAL_COMMUNITY)
Admission: EM | Admit: 2018-12-21 | Discharge: 2018-12-22 | Disposition: A | Discharge status: Home / Self Care | Payer: Medicare Other | Attending: Emergency Medicine | Admitting: Emergency Medicine

## 2018-12-21 DIAGNOSIS — E039 Hypothyroidism, unspecified: Secondary | ICD-10-CM | POA: Diagnosis not present

## 2018-12-21 DIAGNOSIS — R42 Dizziness and giddiness: Secondary | ICD-10-CM | POA: Insufficient documentation

## 2018-12-21 DIAGNOSIS — Z79899 Other long term (current) drug therapy: Secondary | ICD-10-CM | POA: Diagnosis not present

## 2018-12-21 LAB — BASIC METABOLIC PANEL
Anion gap: 10 (ref 5–15)
BUN: 11 mg/dL (ref 8–23)
CO2: 24 mmol/L (ref 22–32)
Calcium: 9.8 mg/dL (ref 8.9–10.3)
Chloride: 105 mmol/L (ref 98–111)
Creatinine, Ser: 0.76 mg/dL (ref 0.44–1.00)
GFR calc Af Amer: >60 mL/min (ref >60)
GFR calc non Af Amer: >60 mL/min (ref >60)
Glucose, Bld: 164 mg/dL — ABNORMAL HIGH (ref 70–99)
Potassium: 4.1 mmol/L (ref 3.5–5.1)
Sodium: 139 mmol/L (ref 135–145)

## 2018-12-21 LAB — CBC
HCT: 46.2 % — ABNORMAL HIGH (ref 36.0–46.0)
Hemoglobin: 14.9 g/dL (ref 12.0–15.0)
MCH: 29.1 pg (ref 26.0–34.0)
MCHC: 32.3 g/dL (ref 30.0–36.0)
MCV: 90.2 fL (ref 80.0–100.0)
Platelets: 171 10*3/uL (ref 150–400)
RBC: 5.12 MIL/uL — ABNORMAL HIGH (ref 3.87–5.11)
RDW: 13.4 % (ref 11.5–15.5)
WBC: 4.4 10*3/uL (ref 4.0–10.5)
nRBC: 0 % (ref 0.0–0.2)

## 2018-12-21 LAB — URINALYSIS, ROUTINE W REFLEX MICROSCOPIC
Bilirubin Urine: NEGATIVE
Glucose, UA: NEGATIVE mg/dL
Hgb urine dipstick: NEGATIVE
Ketones, ur: NEGATIVE mg/dL
Nitrite: NEGATIVE
Protein, ur: NEGATIVE mg/dL
Specific Gravity, Urine: 1.005 (ref 1.005–1.030)
pH: 7 (ref 5.0–8.0)

## 2018-12-21 NOTE — ED Triage Notes (Signed)
Pt states, "I am so sick"  States her head has felt "light" for 1 week and now her "body just feels sick".  Denies pain.  Denies body aches.  Denies fever. Denies nausea and vomiting.

## 2018-12-22 LAB — TROPONIN I: Troponin I: <0.03 ng/mL (ref <0.03)

## 2018-12-22 MED ORDER — MECLIZINE HCL 25 MG PO TABS
12.5000 mg | ORAL_TABLET | Freq: Once | ORAL | Status: AC
  Administered 2018-12-22: 12.5 mg via ORAL
  Filled 2018-12-22: qty 1

## 2018-12-22 NOTE — ED Notes (Signed)
Pt ambulated with very little assistance. Slight unsteadiness on standing. States feeling in head does not get better or worse. Will continue to monitor.

## 2018-12-22 NOTE — ED Notes (Signed)
Patient ambulated in hall by Dr. Eudelia Bunch, tolerated ambulation well with NAD

## 2018-12-22 NOTE — ED Provider Notes (Signed)
[facility] EMERGENCY DEPARTMENT Provider Note  CSN: [id number] Arrival date & time: 12/21/18 2207  Chief Complaint(s) SICK and Dizziness  HPI Sonya KOZLOSKI is a 85 y.o. female   The history is provided by the patient.  Dizziness  Quality:  Lightheadedness Severity:  Moderate Onset quality:  Gradual Duration:  1 week Timing:  Constant Progression:  Waxing and waning Chronicity: similar to prior vertigo, but has additional sensation of feeling "dizzy in the body" Context: not when bending over, not with head movement, not with inactivity, not with loss of consciousness, not with physical activity, not when standing up and not when urinating   Relieved by:  Nothing Worsened by:  Nothing Ineffective treatments: meclizine. Associated symptoms: no blood in stool, no chest pain, no diarrhea, no headaches, no nausea, no palpitations, no shortness of breath, no tinnitus, no vision changes, no vomiting and no weakness   Risk factors: hx of vertigo and multiple medications     Past Medical History Past Medical History:  Diagnosis Date  . Cataract   . Hyperlipidemia    Patient Active Problem List   Diagnosis Date Noted  . Hypothyroidism 02/26/2016  . Hyperlipidemia 12/05/2015   Home Medication(s) Prior to Admission medications   Medication Sig Start Date End Date Taking? Authorizing Provider  FENOFIBRATE PO Take by mouth.    [provider]  Levothyroxine Sodium (SYNTHROID PO) Take by mouth.    [provider]  nitrofurantoin, macrocrystal-monohydrate, (MACROBID) 100 MG capsule Take 1 capsule (100 mg total) by mouth 2 (two) times daily. 02/26/16   Tonye Pearson, MD  phenazopyridine (PYRIDIUM) 100 MG tablet One now and one in am 02/26/16   Tonye Pearson, MD  simvastatin (ZOCOR) 5 MG tablet Take 5 mg by mouth daily.    [provider]  triamcinolone cream (KENALOG) 0.1 % Apply 1 application topically 2 (two) times daily.     [provider]  triamterene-hydrochlorothiazide (DYAZIDE) 37.5-25 MG capsule Take 1 capsule by mouth daily.    [provider]                                                                                                                                    Past Surgical History Past Surgical History:  Procedure Laterality Date  . ABDOMINAL HYSTERECTOMY     Family History Family History  Problem Relation Age of Onset  . Cancer Mother   . Mental illness Sister     Social History Social History   Tobacco Use  . Smoking status: Never Smoker  . Smokeless tobacco: Never Used  Substance Use Topics  . Alcohol use: Never    Alcohol/week: 0.0 standard drinks    Frequency: Never  . Drug use: Never   Allergies Penicillins  Review of Systems Review of Systems  HENT: Negative for tinnitus.   Respiratory: Negative for shortness of breath.   Cardiovascular: Negative for  chest pain and palpitations.  Gastrointestinal: Negative for blood in stool, diarrhea, nausea and vomiting.  Neurological: Positive for dizziness. Negative for weakness and headaches.   All other systems are reviewed and are negative for acute change except as noted in the HPI  Physical Exam Vital Signs  I have reviewed the triage vital signs BP (!) 150/66 (BP Location: Right Arm)   Pulse 96   Temp 98.3 F (36.8 C) (Oral)   Resp 18   SpO2 99%   Physical Exam Vitals signs reviewed.  Constitutional:      General: She is not in acute distress.    Appearance: She is well-developed. She is not diaphoretic.  HENT:     Head: Normocephalic and atraumatic.     Nose: Nose normal.  Eyes:     General: No scleral icterus.       Right eye: No discharge.        Left eye: No discharge.     Conjunctiva/sclera: Conjunctivae normal.     Pupils: Pupils are equal, round, and reactive to light.  Neck:     Musculoskeletal: Normal range of motion and neck supple.  Cardiovascular:     Rate and Rhythm:  Normal rate and regular rhythm.     Heart sounds: No murmur. No friction rub. No gallop.   Pulmonary:     Effort: Pulmonary effort is normal. No respiratory distress.     Breath sounds: Normal breath sounds. No stridor. No rales.  Abdominal:     General: There is no distension.     Palpations: Abdomen is soft.     Tenderness: There is no abdominal tenderness.  Musculoskeletal:        General: No tenderness.  Skin:    General: Skin is warm and dry.     Findings: No erythema or rash.  Neurological:     Mental Status: She is alert and oriented to person, place, and time.     Comments: Mental Status:  Alert and oriented to person, place, and time.  Attention and concentration normal.  Speech clear.  Recent memory is intact  Cranial Nerves:  II Visual Fields: Intact to confrontation. Visual fields intact. III, IV, VI: Pupils equal and reactive to light and near. Full eye movement without nystagmus  V Facial Sensation: Normal. No weakness of masticatory muscles  VII: No facial weakness or asymmetry  VIII Auditory Acuity: Grossly normal  IX/X: The uvula is midline; the palate elevates symmetrically  XI: Normal sternocleidomastoid and trapezius strength  XII: The tongue is midline. No atrophy or fasciculations.   Motor System: Muscle Strength: 5/5 and symmetric in the upper and lower extremities. No pronation or drift.  Muscle Tone: Tone and muscle bulk are normal in the upper and lower extremities.   Reflexes: DTRs: 1+ and symmetrical in all four extremities. No Clonus Coordination: Intact finger-to-nose. No tremor.  Sensation: Intact to light touch, and pinprick.  Gait: mild ataxia      ED Results and Treatments Labs (all labs ordered are listed, but only abnormal results are displayed) Labs Reviewed  BASIC METABOLIC PANEL - Abnormal; Notable for the following components:      Result Value   Glucose, Bld 164 (*)    All other components within normal limits  CBC - Abnormal;  Notable for the following components:   RBC 5.12 (*)    HCT 46.2 (*)    All other components within normal limits  URINALYSIS, ROUTINE W REFLEX MICROSCOPIC - Abnormal; Notable  for the following components:   Color, Urine STRAW (*)    Leukocytes,Ua TRACE (*)    Bacteria, UA RARE (*)    All other components within normal limits  TROPONIN I                                                                                                                         EKG  EKG Interpretation  Date/Time:  Wednesday December 21 2018 22:17:23 EDT Ventricular Rate:  87 PR Interval:  162 QRS Duration: 118 QT Interval:  390 QTC Calculation: 469 R Axis:   -83 Text Interpretation:  Normal sinus rhythm Right atrial enlargement Left axis deviation Low voltage QRS Right bundle branch block Abnormal ECG NO STEMI. Confirmed by Drema Pryardama, Pedro [phone]([id number]) on 12/22/2018 12:43:06 AM      Radiology No results found. Pertinent labs & imaging results that were available during my care of the patient were reviewed by me and considered in my medical decision making (see chart for details).  Medications Ordered in ED Medications  meclizine (ANTIVERT) tablet 12.5 mg (12.5 mg Oral Given 12/22/18 0256)                                                                                                                                    Procedures Procedures  (including critical care time)  Medical Decision Making / ED Course I have reviewed the nursing notes for this encounter and the patient's prior records (if available in EHR or on provided paperwork).    Patient presents with similar vertigo but has additional "dizziness of the body."  She denies any overt pain.  No shortness of breath.  No urinary symptoms.  No infectious symptoms.  She is currently afebrile with stable vital signs.  Exam was nonfocal. Doubt CVA.  EKG with new right bundle branch block but otherwise nonischemic.  Troponin negative.  Labs grossly  reassuring without leukocytosis or  anemia.  No electrolyte derangements or renal insufficiency.  Patient was provided with oral hydration and additional dose of meclizine.  Following this patient reported that her symptoms significantly improved.  I was able to ambulate the patient and her mild ataxia had resolved.  The patient appears reasonably screened and/or stabilized for discharge and I doubt any other medical condition or other [facility] requiring further screening, evaluation, or treatment in the ED at this time prior to discharge.  The patient  is safe for discharge with strict return precautions.   Final Clinical Impression(s) / ED Diagnoses Final diagnoses:  Dizziness    Disposition: Discharge  Condition: Good  I have discussed the results, Dx and Tx plan with the patient who expressed understanding and agree(s) with the plan. Discharge instructions discussed at great length. The patient was given strict return precautions who verbalized understanding of the instructions. No further questions at time of discharge.    ED Discharge Orders    None       Follow Up: Andi Devon, MD [address] [place] [address] [phone]  Schedule an appointment as soon as possible for a visit  in 3-5 days, If symptoms do not improve or  worsen     This chart was dictated using voice recognition software.  Despite best efforts to proofread,  errors can occur which can change the documentation meaning.   Nira Conn, MD 12/22/18 [phone]

## 2018-12-22 NOTE — ED Notes (Signed)
Patient verbalized understanding of dc instructions, vss, ambulatory with nad.   

## 2018-12-25 ENCOUNTER — Emergency Department (HOSPITAL_COMMUNITY): Payer: Medicare Other

## 2018-12-25 ENCOUNTER — Other Ambulatory Visit: Payer: Self-pay

## 2018-12-25 ENCOUNTER — Emergency Department (HOSPITAL_COMMUNITY)
Admission: EM | Admit: 2018-12-25 | Discharge: 2018-12-25 | Disposition: A | Discharge status: Home / Self Care | Payer: Medicare Other | Attending: Emergency Medicine | Admitting: Emergency Medicine

## 2018-12-25 DIAGNOSIS — R05 Cough: Secondary | ICD-10-CM | POA: Diagnosis present

## 2018-12-25 DIAGNOSIS — Z79899 Other long term (current) drug therapy: Secondary | ICD-10-CM | POA: Insufficient documentation

## 2018-12-25 DIAGNOSIS — E039 Hypothyroidism, unspecified: Secondary | ICD-10-CM | POA: Diagnosis not present

## 2018-12-25 DIAGNOSIS — B349 Viral infection, unspecified: Secondary | ICD-10-CM

## 2018-12-25 LAB — URINALYSIS, ROUTINE W REFLEX MICROSCOPIC
Bacteria, UA: NONE SEEN
Bilirubin Urine: NEGATIVE
Glucose, UA: NEGATIVE mg/dL
Hgb urine dipstick: NEGATIVE
Ketones, ur: NEGATIVE mg/dL
Nitrite: NEGATIVE
Protein, ur: NEGATIVE mg/dL
Specific Gravity, Urine: 1.006 (ref 1.005–1.030)
pH: 7 (ref 5.0–8.0)

## 2018-12-25 LAB — CBC WITH DIFFERENTIAL/PLATELET
Abs Immature Granulocytes: 0 10*3/uL (ref 0.00–0.07)
Basophils Absolute: 0 10*3/uL (ref 0.0–0.1)
Basophils Relative: 1 %
Eosinophils Absolute: 0 10*3/uL (ref 0.0–0.5)
Eosinophils Relative: 1 %
HCT: 48 % — ABNORMAL HIGH (ref 36.0–46.0)
Hemoglobin: 15.9 g/dL — ABNORMAL HIGH (ref 12.0–15.0)
Immature Granulocytes: 0 %
Lymphocytes Relative: 30 %
Lymphs Abs: 1.4 10*3/uL (ref 0.7–4.0)
MCH: 30.5 pg (ref 26.0–34.0)
MCHC: 33.1 g/dL (ref 30.0–36.0)
MCV: 92.1 fL (ref 80.0–100.0)
Monocytes Absolute: 0.5 10*3/uL (ref 0.1–1.0)
Monocytes Relative: 10 %
Neutro Abs: 2.7 10*3/uL (ref 1.7–7.7)
Neutrophils Relative %: 58 %
Platelets: 173 10*3/uL (ref 150–400)
RBC: 5.21 MIL/uL — ABNORMAL HIGH (ref 3.87–5.11)
RDW: 13.9 % (ref 11.5–15.5)
WBC: 4.7 10*3/uL (ref 4.0–10.5)
nRBC: 0 % (ref 0.0–0.2)

## 2018-12-25 LAB — COMPREHENSIVE METABOLIC PANEL
ALT: 22 U/L (ref 0–44)
AST: 23 U/L (ref 15–41)
Albumin: 4.5 g/dL (ref 3.5–5.0)
Alkaline Phosphatase: 49 U/L (ref 38–126)
Anion gap: 9 (ref 5–15)
BUN: 15 mg/dL (ref 8–23)
CO2: 28 mmol/L (ref 22–32)
Calcium: 9.5 mg/dL (ref 8.9–10.3)
Chloride: 102 mmol/L (ref 98–111)
Creatinine, Ser: 0.72 mg/dL (ref 0.44–1.00)
GFR calc Af Amer: >60 mL/min (ref >60)
GFR calc non Af Amer: >60 mL/min (ref >60)
Glucose, Bld: 115 mg/dL — ABNORMAL HIGH (ref 70–99)
Potassium: 3.8 mmol/L (ref 3.5–5.1)
Sodium: 139 mmol/L (ref 135–145)
Total Bilirubin: 1.1 mg/dL (ref 0.3–1.2)
Total Protein: 7.9 g/dL (ref 6.5–8.1)

## 2018-12-25 LAB — LIPASE, BLOOD: Lipase: 33 U/L (ref 11–51)

## 2018-12-25 LAB — TROPONIN I: Troponin I: <0.03 ng/mL (ref <0.03)

## 2018-12-25 MED ORDER — SODIUM CHLORIDE 0.9 % IV BOLUS
1000.0000 mL | Freq: Once | INTRAVENOUS | Status: AC
  Administered 2018-12-25: 21:00:00 1000 mL via INTRAVENOUS

## 2018-12-25 NOTE — ED Notes (Signed)
Pt ambulated roughly 100 ft without o2. Pt maintained saturation between 100 and 96%. Pt reported no complaints and was not assisted in ambulation

## 2018-12-25 NOTE — ED Notes (Signed)
Bed: WA12 Expected date:  Expected time:  Means of arrival:  Comments: Neg pressure 

## 2018-12-25 NOTE — ED Provider Notes (Signed)
[facility]-EMERGENCY DEPT Provider Note   CSN: [id number] Arrival date & time: 12/25/18  1737    History   Chief Complaint Chief Complaint  Patient presents with   Chills   Cough    HPI Sonya Gross is a 85 y.o. female.     HPI  Patient is an 85 year old female with a history of cataracts, hyperlipidemia, hypothyroidism, who presents to the emergency department today for evaluation of "feeling sick" for the last 5 days. States that about every 2 hours she has a sick feeling and has chills throughout her body.   States she has had a chronic cough that happens "every now and then" for several years that is unchanged today.   Has had intermittent dizziness. States this only happens when she stands up. Dizziness resolves after a few minutes. Has h/o vertigo and usually takes meclizine.  Denies nausea, vomiting, diarrhea, constipation, abd pain, chest pain, shortness of breath, dyspnea on exertion, bloody or melenotic stools. No dysuria, urgency or hematuria. No headaches, vision changes, numbness, weakness.  Reviewed records. Pt seen in the ED 12/21/18 for eval of dizziness. Sxs improved after IVF and meclizine. Discharged in stable condition.   Past Medical History:  Diagnosis Date   Cataract    Hyperlipidemia     Patient Active Problem List   Diagnosis Date Noted   Hypothyroidism 02/26/2016   Hyperlipidemia 12/05/2015    Past Surgical History:  Procedure Laterality Date   ABDOMINAL HYSTERECTOMY       OB History   No obstetric history on file.      Home Medications    Prior to Admission medications   Medication Sig Start Date End Date Taking? Authorizing Provider  FENOFIBRATE PO Take by mouth.    [provider]  Levothyroxine Sodium (SYNTHROID PO) Take by mouth.    [provider]  nitrofurantoin, macrocrystal-monohydrate, (MACROBID) 100 MG capsule Take 1 capsule (100 mg total) by mouth 2 (two) times daily.  02/26/16   Tonye Pearson, MD  phenazopyridine (PYRIDIUM) 100 MG tablet One now and one in am 02/26/16   Tonye Pearson, MD  simvastatin (ZOCOR) 5 MG tablet Take 5 mg by mouth daily.    [provider]  triamcinolone cream (KENALOG) 0.1 % Apply 1 application topically 2 (two) times daily.    [provider]  triamterene-hydrochlorothiazide (DYAZIDE) 37.5-25 MG capsule Take 1 capsule by mouth daily.    [provider]    Family History Family History  Problem Relation Age of Onset   Cancer Mother    Mental illness Sister     Social History Social History   Tobacco Use   Smoking status: Never Smoker   Smokeless tobacco: Never Used  Substance Use Topics   Alcohol use: Never    Alcohol/week: 0.0 standard drinks    Frequency: Never   Drug use: Never     Allergies   Penicillins   Review of Systems Review of Systems  Constitutional: Negative for chills and fever.  HENT: Negative for ear pain and sore throat.   Eyes: Negative for pain and visual disturbance.  Respiratory: Positive for cough. Negative for shortness of breath.   Cardiovascular: Negative for chest pain.  Gastrointestinal: Negative for abdominal pain, constipation, diarrhea, nausea and vomiting.  Genitourinary: Negative for dysuria and hematuria.  Musculoskeletal: Negative for back pain.  Skin: Negative for color change and rash.  Neurological: Positive for dizziness. Negative for weakness, numbness and headaches.  All other  systems reviewed and are negative.   Physical Exam Updated Vital Signs BP 132/79    Pulse 77    Temp 97.9 F (36.6 C) (Oral)    Resp 12    SpO2 98%   Physical Exam Vitals signs and nursing note reviewed.  Constitutional:      General: She is not in acute distress.    Appearance: She is well-developed. She is not ill-appearing or toxic-appearing.  HENT:     Head: Normocephalic and atraumatic.     Mouth/Throat:     Mouth: Mucous membranes are  moist.     Pharynx: No oropharyngeal exudate or posterior oropharyngeal erythema.  Eyes:     Extraocular Movements: Extraocular movements intact.     Conjunctiva/sclera: Conjunctivae normal.     Pupils: Pupils are equal, round, and reactive to light.     Comments: No nystagmus  Neck:     Musculoskeletal: Neck supple.  Cardiovascular:     Rate and Rhythm: Normal rate and regular rhythm.     Pulses: Normal pulses.     Heart sounds: Normal heart sounds. No murmur.  Pulmonary:     Effort: Pulmonary effort is normal. No respiratory distress.     Breath sounds: Normal breath sounds. No stridor. No wheezing, rhonchi or rales.  Abdominal:     General: Bowel sounds are normal. There is no distension.     Palpations: Abdomen is soft.     Tenderness: There is no abdominal tenderness. There is no guarding.  Musculoskeletal:     Right lower leg: No edema.     Left lower leg: No edema.  Skin:    General: Skin is warm and dry.  Neurological:     Mental Status: She is alert.     Comments: Mental Status:  Alert, thought content appropriate, able to give a coherent history. Speech fluent without evidence of aphasia. Able to follow 2 step commands without difficulty.  Cranial Nerves:  II:  pupils equal, round, reactive to light III,IV, VI: ptosis not present, extra-ocular motions intact bilaterally  V,VII: smile symmetric, facial light touch sensation equal VIII: hearing grossly normal to voice  X: uvula elevates symmetrically  XI: bilateral shoulder shrug symmetric and strong XII: midline tongue extension without fassiculations Motor:  Normal tone. 5/5 strength of BUE and BLE major muscle groups including strong and equal grip strength and dorsiflexion/plantar flexion Sensory: light touch normal in all extremities. Cerebellar: normal finger-to-nose with bilateral upper extremities Gait: normal gait and balance.  CV: 2+ radial and DP pulses      ED Treatments / Results  Labs (all labs  ordered are listed, but only abnormal results are displayed) Labs Reviewed  CBC WITH DIFFERENTIAL/PLATELET - Abnormal; Notable for the following components:      Result Value   RBC 5.21 (*)    Hemoglobin 15.9 (*)    HCT 48.0 (*)    All other components within normal limits  COMPREHENSIVE METABOLIC PANEL - Abnormal; Notable for the following components:   Glucose, Bld 115 (*)    All other components within normal limits  URINALYSIS, ROUTINE W REFLEX MICROSCOPIC - Abnormal; Notable for the following components:   Color, Urine STRAW (*)    Leukocytes,Ua SMALL (*)    All other components within normal limits  TROPONIN I  LIPASE, BLOOD    EKG EKG Interpretation  Date/Time:  Sunday December 25 2018 20:29:19 EDT Ventricular Rate:  76 PR Interval:    QRS Duration: 129 QT Interval:  402 QTC Calculation: 452 R Axis:   -65 Text Interpretation:  Sinus rhythm Right bundle branch block Inferior infarct, old Lateral leads are also involved Baseline wander in lead(s) V4 similar to previous Confirmed by Frederick Peers [id number]) on 12/25/2018 9:12:31 PM   Radiology Dg Chest Portable 1 View  Result Date: 12/25/2018 CLINICAL DATA:  Cough and flu-like symptoms EXAM: PORTABLE CHEST 1 VIEW COMPARISON:  01/20/2016 FINDINGS: Cardiac shadows within normal limits. Aortic calcifications are noted. Lungs are well aerated bilaterally. No focal infiltrate or sizable effusion is seen. Previously noted nodular densities in both lungs are not well appreciated on today's study. Degenerative changes of the thoracic spine are seen. IMPRESSION: No acute abnormality noted. Electronically Signed   By: Alcide Clever M.D.   On: 12/25/2018 19:21    Procedures Procedures (including critical care time)  Medications Ordered in ED Medications  sodium chloride 0.9 % bolus 1,000 mL (1,000 mLs Intravenous New Bag/Given 12/25/18 2058)     Initial Impression / Assessment and Plan / ED Course  I have reviewed the triage vital  signs and the nursing notes.  Pertinent labs & imaging results that were available during my care of the patient were reviewed by me and considered in my medical decision making (see chart for details).   Final Clinical Impressions(s) / ED Diagnoses   Final diagnoses:  Viral syndrome   Pt presenting with vague complaints of intermittent chills and not feeling well for several days. Also with intermittent dizziness associated with standing. Takes meclizine for vertigo.   CBC with no leukocytosis. No anemia, pt hgb is elevated and I question a component of hemoconcentration.  CMP WNL Lipase WNL Trononin negative UA with small leukocytes, 0-5 RBC, 0-5, WBC. No bacteria seen.   Pt ambulated with pulse ox and did not become hypoxic. She did not complaint of dizziness with ambulation.   EKG with normal Sinus rhythm Right bundle branch block Inferior infarct, old Lateral leads are also involved Baseline wander in lead(s) V4 similar to previous   CXR is negative.   Pts complaints are somewhat vague with intermittent chills and rare cough. She also has intermittent dizziness which sounds chronic in nature and is likely from peripheral cause. Her exam is nonfocal and I doubt acute intracranial cause taht would require further w/u at this time. With her constellation of sxs I suspect she may have a viral syndrome. Clinically, she also did appear to be somewhat dehydrated today and orthostatics were positive so this may be contributing. I do feel she is stable for discharge with close pcp f/u.   Pt seen in conjunction with Dr. Clarene Duke who personally evaluated the pt and is in agreement with plan.   ED Discharge Orders    None       Rayne Du 12/25/18 2151    Little, Ambrose Finland, MD 12/25/18 2204

## 2018-12-25 NOTE — ED Triage Notes (Signed)
Pt reports that for the last week she has been feeling sick. PT reports about every 3-4 hours she feels a rush of chills and nausea. Pt is alert and oriented and sounds a little out of breath after walking to room.  Pt is alert and oriented x4

## 2018-12-25 NOTE — Discharge Instructions (Signed)
Please follow up with your primary care provider within 3-5 days for re-evaluation of your symptoms. If you do not have a primary care provider, information for a healthcare clinic has been provided for you to make arrangements for follow up care. Please return to the emergency department for any new or worsening symptoms. ° °

## 2018-12-27 ENCOUNTER — Emergency Department (HOSPITAL_COMMUNITY): Payer: Medicare Other

## 2018-12-27 ENCOUNTER — Emergency Department (HOSPITAL_COMMUNITY)
Admission: EM | Admit: 2018-12-27 | Discharge: 2018-12-28 | Disposition: A | Discharge status: Home / Self Care | Payer: Medicare Other | Attending: Emergency Medicine | Admitting: Emergency Medicine

## 2018-12-27 ENCOUNTER — Other Ambulatory Visit: Payer: Self-pay

## 2018-12-27 DIAGNOSIS — N3000 Acute cystitis without hematuria: Secondary | ICD-10-CM | POA: Insufficient documentation

## 2018-12-27 DIAGNOSIS — Z79899 Other long term (current) drug therapy: Secondary | ICD-10-CM | POA: Insufficient documentation

## 2018-12-27 DIAGNOSIS — R531 Weakness: Secondary | ICD-10-CM | POA: Diagnosis present

## 2018-12-27 DIAGNOSIS — R0602 Shortness of breath: Secondary | ICD-10-CM

## 2018-12-27 LAB — BASIC METABOLIC PANEL
Anion gap: 12 (ref 5–15)
BUN: 12 mg/dL (ref 8–23)
CO2: 22 mmol/L (ref 22–32)
Calcium: 9.8 mg/dL (ref 8.9–10.3)
Chloride: 105 mmol/L (ref 98–111)
Creatinine, Ser: 0.97 mg/dL (ref 0.44–1.00)
GFR calc Af Amer: >60 mL/min (ref >60)
GFR calc non Af Amer: 53 mL/min — ABNORMAL LOW (ref >60)
Glucose, Bld: 135 mg/dL — ABNORMAL HIGH (ref 70–99)
Potassium: 3.8 mmol/L (ref 3.5–5.1)
Sodium: 139 mmol/L (ref 135–145)

## 2018-12-27 LAB — CBC
HCT: 45.8 % (ref 36.0–46.0)
Hemoglobin: 14.7 g/dL (ref 12.0–15.0)
MCH: 29 pg (ref 26.0–34.0)
MCHC: 32.1 g/dL (ref 30.0–36.0)
MCV: 90.3 fL (ref 80.0–100.0)
Platelets: 176 10*3/uL (ref 150–400)
RBC: 5.07 MIL/uL (ref 3.87–5.11)
RDW: 13.8 % (ref 11.5–15.5)
WBC: 5.2 10*3/uL (ref 4.0–10.5)
nRBC: 0 % (ref 0.0–0.2)

## 2018-12-27 MED ORDER — SODIUM CHLORIDE 0.9% FLUSH
3.0000 mL | Freq: Once | INTRAVENOUS | Status: AC
  Administered 2018-12-28: 3 mL via INTRAVENOUS

## 2018-12-27 NOTE — ED Triage Notes (Signed)
Pt here for generalized weakness x 1 week and sob onset tonight. Lung sounds clear, 98% room air, NAD. Denies pain.

## 2018-12-28 LAB — URINALYSIS, ROUTINE W REFLEX MICROSCOPIC
Bilirubin Urine: NEGATIVE
Glucose, UA: NEGATIVE mg/dL
Hgb urine dipstick: NEGATIVE
Ketones, ur: NEGATIVE mg/dL
Nitrite: NEGATIVE
Protein, ur: NEGATIVE mg/dL
Specific Gravity, Urine: 1.012 (ref 1.005–1.030)
pH: 6 (ref 5.0–8.0)

## 2018-12-28 LAB — I-STAT TROPONIN, ED: Troponin i, poc: 0 ng/mL (ref 0.00–0.08)

## 2018-12-28 LAB — I-STAT BETA HCG BLOOD, ED (MC, WL, AP ONLY): I-stat hCG, quantitative: <5 m[IU]/mL (ref <5)

## 2018-12-28 LAB — CBG MONITORING, ED: Glucose-Capillary: 105 mg/dL — ABNORMAL HIGH (ref 70–99)

## 2018-12-28 MED ORDER — CEPHALEXIN 500 MG PO CAPS: 500.0000 mg | ORAL_CAPSULE | Freq: Two times a day (BID) | ORAL | 0 refills | Status: DC

## 2018-12-28 MED ORDER — SODIUM CHLORIDE 0.9 % IV SOLN
1.0000 g | Freq: Once | INTRAVENOUS | Status: AC
  Administered 2018-12-28: 1 g via INTRAVENOUS
  Filled 2018-12-28: qty 10

## 2018-12-28 NOTE — ED Provider Notes (Signed)
TIME SEEN: 2:32 AM  CHIEF COMPLAINT: Generalized weakness, shortness of breath  HPI: Patient is an 85 year old female with history of hyperlipidemia, hypothyroidism who presents to the emergency department with complaints of feeling weak for the past week.  States tonight while on the phone around 10 PM she developed an episode of feeling she could not catch her breath.  States this lasted for approximately 5 minutes.  Denies fevers, cough, wheezing, chest pain or chest discomfort.  No lower extremity swelling or pain.  No history of PE, DVT, CHF.  Patient states now she is feeling better.  No vomiting or diarrhea.  Eating and drinking well.  ROS: See HPI Constitutional: no fever  Eyes: no drainage  ENT: no runny nose   Cardiovascular:  no chest pain  Resp:  SOB  GI: no vomiting GU: no dysuria Integumentary: no rash  Allergy: no hives  Musculoskeletal: no leg swelling  Neurological: no slurred speech ROS otherwise negative  PAST MEDICAL HISTORY/PAST SURGICAL HISTORY:  Past Medical History:  Diagnosis Date  . Cataract   . Hyperlipidemia     MEDICATIONS:  Prior to Admission medications   Medication Sig Start Date End Date Taking? Authorizing Provider  FENOFIBRATE PO Take by mouth.    [provider]  Levothyroxine Sodium (SYNTHROID PO) Take by mouth.    [provider]  nitrofurantoin, macrocrystal-monohydrate, (MACROBID) 100 MG capsule Take 1 capsule (100 mg total) by mouth 2 (two) times daily. 02/26/16   Tonye Pearsonoolittle, Robert P, MD  phenazopyridine (PYRIDIUM) 100 MG tablet One now and one in am 02/26/16   Tonye Pearsonoolittle, Robert P, MD  simvastatin (ZOCOR) 5 MG tablet Take 5 mg by mouth daily.    [provider]  triamcinolone cream (KENALOG) 0.1 % Apply 1 application topically 2 (two) times daily.    [provider]  triamterene-hydrochlorothiazide (DYAZIDE) 37.5-25 MG capsule Take 1 capsule by mouth daily.    [provider]    ALLERGIES:   Allergies  Allergen Reactions  . Penicillins     SOCIAL HISTORY:  Social History   Tobacco Use  . Smoking status: Never Smoker  . Smokeless tobacco: Never Used  Substance Use Topics  . Alcohol use: Never    Alcohol/week: 0.0 standard drinks    Frequency: Never    FAMILY HISTORY: Family History  Problem Relation Age of Onset  . Cancer Mother   . Mental illness Sister     EXAM: BP (!) 149/80 (BP Location: Right Arm)   Pulse 90   Temp 98 F (36.7 C) (Oral)   Resp 16   Ht 5\' 6"  (1.676 m)   Wt 61.2 kg   SpO2 98%   BMI 21.79 kg/m  CONSTITUTIONAL: Alert and oriented and responds appropriately to questions. Well-appearing; well-nourished, elderly, in no distress HEAD: Normocephalic EYES: Conjunctivae clear, pupils appear equal, EOMI ENT: normal nose; moist mucous membranes NECK: Supple, no meningismus, no nuchal rigidity, no LAD  CARD: RRR; S1 and S2 appreciated; no murmurs, no clicks, no rubs, no gallops RESP: Normal chest excursion without splinting or tachypnea; breath sounds clear and equal bilaterally; no wheezes, no rhonchi, no rales, no hypoxia or respiratory distress, speaking full sentences ABD/GI: Normal bowel sounds; non-distended; soft, non-tender, no rebound, no guarding, no peritoneal signs, no hepatosplenomegaly BACK:  The back appears normal and is non-tender to palpation, there is no CVA tenderness EXT: Normal ROM in all joints; non-tender to palpation; no edema; normal capillary refill; no cyanosis, no calf tenderness or swelling  SKIN: Normal color for age and race; warm; no rash NEURO: Moves all extremities equally PSYCH: The patient's mood and manner are appropriate. Grooming and personal hygiene are appropriate.  MEDICAL DECISION MAKING: Patient here with shortness of breath.  States lasted 5 minutes and has resolved.  Has had a week of generalized weakness and fatigue.  No complaints of focal neurologic deficits.  States she is feeling better now.   Labs obtained in triage unremarkable.  Chest x-ray clear.  EKG shows no new ischemic changes.  Will obtain troponin and monitor patient.  Will obtain urinalysis.  ED PROGRESS: Patient's urine appears infected.  We will send urine culture.  Previous urine culture and 2017 grew E. coli that was pansensitive.  Will give dose of Rocephin here.  Patient feels well.  Troponin negative.  I feel she is safe to be discharged home with prescription of Keflex.  Discussed return precautions.   At this time, I do not feel there is any life-threatening condition present. I have reviewed and discussed all results (EKG, imaging, lab, urine as appropriate) and exam findings with patient/family. I have reviewed nursing notes and appropriate previous records.  I feel the patient is safe to be discharged home without further emergent workup and can continue workup as an outpatient as needed. Discussed usual and customary return precautions. Patient/family verbalize understanding and are comfortable with this plan.  Outpatient follow-up has been provided as needed. All questions have been answered.    EKG Interpretation  Date/Time:  Wednesday December 28 2018 01:58:52 EDT Ventricular Rate:  84 PR Interval:    QRS Duration: 134 QT Interval:  382 QTC Calculation: 452 R Axis:   -53 Text Interpretation:  Age not entered, assumed to be  85 years old for purpose of ECG interpretation Sinus rhythm Consider right atrial enlargement Right bundle branch block Inferior infarct, old No significant change since last tracing Confirmed by Hinda Lindor, Baxter Hire [phone]) on 12/28/2018 2:34:25 AM          Hardie Veltre, Layla Maw, DO 12/28/18 [phone]

## 2018-12-28 NOTE — ED Notes (Signed)
HCG ran in error,  Occupational hygienist.  I will have POC credit patient's account.

## 2018-12-29 LAB — URINE CULTURE

## 2019-01-11 ENCOUNTER — Other Ambulatory Visit: Payer: Self-pay | Admitting: Internal Medicine

## 2019-01-11 ENCOUNTER — Ambulatory Visit
Admission: RE | Admit: 2019-01-11 | Discharge: 2019-01-11 | Disposition: A | Discharge status: Home / Self Care | Payer: Medicare Other | Source: Ambulatory Visit | Attending: Internal Medicine | Admitting: Internal Medicine

## 2019-01-11 DIAGNOSIS — R0602 Shortness of breath: Secondary | ICD-10-CM

## 2019-02-04 ENCOUNTER — Encounter (HOSPITAL_COMMUNITY): Payer: Self-pay | Admitting: Emergency Medicine

## 2019-02-04 ENCOUNTER — Other Ambulatory Visit: Payer: Self-pay

## 2019-02-04 ENCOUNTER — Emergency Department (HOSPITAL_COMMUNITY)
Admission: EM | Admit: 2019-02-04 | Discharge: 2019-02-04 | Disposition: A | Discharge status: Home / Self Care | Payer: Medicare Other | Attending: Emergency Medicine | Admitting: Emergency Medicine

## 2019-02-04 ENCOUNTER — Emergency Department (HOSPITAL_COMMUNITY): Payer: Medicare Other

## 2019-02-04 DIAGNOSIS — Z7982 Long term (current) use of aspirin: Secondary | ICD-10-CM | POA: Insufficient documentation

## 2019-02-04 DIAGNOSIS — R5381 Other malaise: Secondary | ICD-10-CM

## 2019-02-04 DIAGNOSIS — R531 Weakness: Secondary | ICD-10-CM | POA: Diagnosis present

## 2019-02-04 DIAGNOSIS — Z79899 Other long term (current) drug therapy: Secondary | ICD-10-CM | POA: Insufficient documentation

## 2019-02-04 DIAGNOSIS — E039 Hypothyroidism, unspecified: Secondary | ICD-10-CM | POA: Insufficient documentation

## 2019-02-04 LAB — CBC WITH DIFFERENTIAL/PLATELET
Abs Immature Granulocytes: 0.01 10*3/uL (ref 0.00–0.07)
Basophils Absolute: 0 10*3/uL (ref 0.0–0.1)
Basophils Relative: 1 %
Eosinophils Absolute: 0.1 10*3/uL (ref 0.0–0.5)
Eosinophils Relative: 1 %
HCT: 45.9 % (ref 36.0–46.0)
Hemoglobin: 15 g/dL (ref 12.0–15.0)
Immature Granulocytes: 0 %
Lymphocytes Relative: 24 %
Lymphs Abs: 1.2 10*3/uL (ref 0.7–4.0)
MCH: 29.9 pg (ref 26.0–34.0)
MCHC: 32.7 g/dL (ref 30.0–36.0)
MCV: 91.4 fL (ref 80.0–100.0)
Monocytes Absolute: 0.5 10*3/uL (ref 0.1–1.0)
Monocytes Relative: 10 %
Neutro Abs: 3.2 10*3/uL (ref 1.7–7.7)
Neutrophils Relative %: 64 %
Platelets: 173 10*3/uL (ref 150–400)
RBC: 5.02 MIL/uL (ref 3.87–5.11)
RDW: 14.3 % (ref 11.5–15.5)
WBC: 5 10*3/uL (ref 4.0–10.5)
nRBC: 0 % (ref 0.0–0.2)

## 2019-02-04 LAB — COMPREHENSIVE METABOLIC PANEL
ALT: 19 U/L (ref 0–44)
AST: 24 U/L (ref 15–41)
Albumin: 4 g/dL (ref 3.5–5.0)
Alkaline Phosphatase: 45 U/L (ref 38–126)
Anion gap: 13 (ref 5–15)
BUN: 10 mg/dL (ref 8–23)
CO2: 23 mmol/L (ref 22–32)
Calcium: 10.2 mg/dL (ref 8.9–10.3)
Chloride: 102 mmol/L (ref 98–111)
Creatinine, Ser: 0.75 mg/dL (ref 0.44–1.00)
GFR calc Af Amer: >60 mL/min (ref >60)
GFR calc non Af Amer: >60 mL/min (ref >60)
Glucose, Bld: 96 mg/dL (ref 70–99)
Potassium: 3.8 mmol/L (ref 3.5–5.1)
Sodium: 138 mmol/L (ref 135–145)
Total Bilirubin: 1.5 mg/dL — ABNORMAL HIGH (ref 0.3–1.2)
Total Protein: 7.2 g/dL (ref 6.5–8.1)

## 2019-02-04 LAB — URINALYSIS, ROUTINE W REFLEX MICROSCOPIC
Bacteria, UA: NONE SEEN
Bilirubin Urine: NEGATIVE
Glucose, UA: NEGATIVE mg/dL
Hgb urine dipstick: NEGATIVE
Ketones, ur: NEGATIVE mg/dL
Nitrite: NEGATIVE
Protein, ur: NEGATIVE mg/dL
Specific Gravity, Urine: 1.003 — ABNORMAL LOW (ref 1.005–1.030)
pH: 6 (ref 5.0–8.0)

## 2019-02-04 LAB — T4, FREE: Free T4: 0.72 ng/dL — ABNORMAL LOW (ref 0.82–1.77)

## 2019-02-04 LAB — ACETAMINOPHEN LEVEL: Acetaminophen (Tylenol), Serum: <10 ug/mL — ABNORMAL LOW (ref 10–30)

## 2019-02-04 LAB — LIPASE, BLOOD: Lipase: 29 U/L (ref 11–51)

## 2019-02-04 LAB — TROPONIN I: Troponin I: <0.03 ng/mL (ref <0.03)

## 2019-02-04 LAB — SALICYLATE LEVEL: Salicylate Lvl: <7 mg/dL (ref 2.8–30.0)

## 2019-02-04 LAB — TSH: TSH: 5.166 u[IU]/mL — ABNORMAL HIGH (ref 0.350–4.500)

## 2019-02-04 NOTE — Discharge Instructions (Signed)
Your TSH was 5.17 and Free T4 was 0.72. These are abnormal and suggest you need to increase your medication. Call your primary doctor to schedule appointment this week to discuss. Return to ER if  you have fever, chest pain, breathing problems, or stroke-like symptoms.

## 2019-02-04 NOTE — ED Triage Notes (Signed)
Pt in with c/o weakness, chills and overall feeling of dehydration. States these symptoms have persisted x 6 wks, and she has has 2 negative Covid tests throughout. Denies any sob or fever presently

## 2019-02-04 NOTE — ED Notes (Signed)
Patient transported to X-ray 

## 2019-02-04 NOTE — ED Notes (Signed)
Pt given discharge instructions and follow up information. Pt given the opportunity to ask questions. Pt verbalized understanding. Pt discharged from the ED.  

## 2019-02-04 NOTE — ED Provider Notes (Signed)
[facility] EMERGENCY DEPARTMENT Provider Note   CSN: [id number] Arrival date & time: 02/04/19  0801    History   Chief Complaint Chief Complaint  Patient presents with  . Dehydration  . Chills  . Weakness    HPI Sonya Gross is a 85 y.o. female.     85yo F w/ PMH including HLD, hypothyroidism who p/w weakness. Pt reports that for a while now she has felt "sick." She denies any specific symptoms including no cough, sore throat, runny nose, diarrhea, vomiting, urinary symptoms, abd pain, CP, SOB, or skin problems. She states she thinks she is dehydrated, but she admits she has been eating and drinking normally. She notes recent unintentional weight loss and occasional chills. No night sweats, bloody stools.She has been evaluated in the ED previously and last saw PCP 2 weeks ago for this. She has had 2 negative COVID-19 tests. No sick contacts or recent travel.   The history is provided by the patient.  Weakness    Past Medical History:  Diagnosis Date  . Cataract   . Hyperlipidemia     Patient Active Problem List   Diagnosis Date Noted  . Hypothyroidism 02/26/2016  . Hyperlipidemia 12/05/2015    Past Surgical History:  Procedure Laterality Date  . ABDOMINAL HYSTERECTOMY       OB History   No obstetric history on file.      Home Medications    Prior to Admission medications   Medication Sig Start Date End Date Taking? Authorizing Provider  albuterol (VENTOLIN HFA) 108 (90 Base) MCG/ACT inhaler Inhale 2 puffs into the lungs every 4 (four) hours as needed for wheezing or shortness of breath.  01/05/19  Yes [provider]  alendronate (FOSAMAX) 70 MG tablet Take 70 mg by mouth every Monday.   Yes [provider]  aspirin EC 81 MG tablet Take 81 mg by mouth daily.   Yes [provider]  bimatoprost (LUMIGAN) 0.01 % SOLN Place 1 drop into the right eye at bedtime.   Yes [provider]  budesonide-formoterol  (SYMBICORT) 80-4.5 MCG/ACT inhaler Inhale 2 puffs into the lungs 2 (two) times a day.   Yes [provider]  Fenofibrate 40 MG TABS Take 40 mg by mouth daily.  11/15/18  Yes [provider]  levocetirizine (XYZAL) 5 MG tablet Take 5 mg by mouth daily. 12/21/18  Yes [provider]  levothyroxine (SYNTHROID) 25 MCG tablet Take 25 mcg by mouth daily. 12/14/18  Yes [provider]  meclizine (ANTIVERT) 25 MG tablet Take 25 mg by mouth 3 (three) times daily as needed for dizziness.    Yes [provider]  montelukast (SINGULAIR) 10 MG tablet Take 10 mg by mouth daily. 01/20/19  Yes [provider]  rosuvastatin (CRESTOR) 20 MG tablet Take 20 mg by mouth daily.   Yes [provider]  triamterene-hydrochlorothiazide (DYAZIDE) 37.5-25 MG capsule Take 1 capsule by mouth daily.   Yes [provider]  vitamin C (ASCORBIC ACID) 500 MG tablet Take 1,000 mg by mouth daily.   Yes [provider]  zinc gluconate 50 MG tablet Take 50 mg by mouth daily.   Yes [provider]    Family History Family History  Problem Relation Age of Onset  . Cancer Mother   . Mental illness Sister     Social History Social History   Tobacco Use  . Smoking status: Never Smoker  . Smokeless tobacco: Never Used  Substance Use Topics  . Alcohol use: Never    Alcohol/week: 0.0 standard drinks    Frequency: Never  . Drug use: Never     Allergies   Penicillins   Review of Systems Review of Systems  Neurological: Positive for weakness.   All other systems reviewed and are negative except that which was mentioned in HPI   Physical Exam Updated Vital Signs BP 120/72   Pulse 79   Temp 98.6 F (37 C)   Resp 18   Ht 5\' 6"  (1.676 m)   Wt 61.2 kg   SpO2 96%   BMI 21.78 kg/m   Physical Exam Vitals signs and nursing note reviewed.  Constitutional:      General: She is not in acute distress.    Appearance: She is  well-developed.     Comments: Thin, alert and comfortable  HENT:     Head: Normocephalic and atraumatic.     Nose: Nose normal.     Mouth/Throat:     Mouth: Mucous membranes are moist.     Pharynx: Oropharynx is clear.  Eyes:     Conjunctiva/sclera: Conjunctivae normal.  Neck:     Musculoskeletal: Neck supple.  Cardiovascular:     Rate and Rhythm: Normal rate and regular rhythm.     Heart sounds: Normal heart sounds. No murmur.  Pulmonary:     Effort: Pulmonary effort is normal.     Breath sounds: Normal breath sounds.  Abdominal:     General: Bowel sounds are normal. There is no distension.     Palpations: Abdomen is soft.     Tenderness: There is no abdominal tenderness.  Musculoskeletal:     Right lower leg: No edema.     Left lower leg: No edema.  Lymphadenopathy:     Cervical: No cervical adenopathy.  Skin:    General: Skin is warm and dry.  Neurological:     Mental Status: She is alert and oriented to person, place, and time.     Comments: Fluent speech  Psychiatric:        Judgment: Judgment normal.      ED Treatments / Results  Labs (all labs ordered are listed, but only abnormal results are displayed) Labs Reviewed  COMPREHENSIVE METABOLIC PANEL - Abnormal; Notable for the following components:      Result Value   Total Bilirubin 1.5 (*)    All other components within normal limits  ACETAMINOPHEN LEVEL - Abnormal; Notable for the following components:   Acetaminophen (Tylenol), Serum <10 (*)    All other components within normal limits  URINALYSIS, ROUTINE W REFLEX MICROSCOPIC - Abnormal; Notable for the following components:   Color, Urine STRAW (*)    Specific Gravity, Urine 1.003 (*)    Leukocytes,Ua TRACE (*)    All other components within normal limits  TSH - Abnormal; Notable for the following components:   TSH 5.166 (*)    All other components within normal limits  T4, FREE - Abnormal; Notable for the following components:   Free T4 0.72 (*)     All other components within normal limits  SALICYLATE LEVEL  LIPASE, BLOOD  CBC WITH DIFFERENTIAL/PLATELET  TROPONIN I    EKG EKG Interpretation  Date/Time:  Saturday Feb 04 2019 08:09:51 EDT Ventricular Rate:  90 PR Interval:    QRS Duration: 135 QT Interval:  390 QTC Calculation: 478 R Axis:   -66 Text Interpretation:  Sinus rhythm Right bundle branch block No significant change since  last tracing Confirmed by Frederick Peers [phone]) on 02/04/2019 9:16:23 AM   Radiology Dg Chest 2 View  Result Date: 02/04/2019 CLINICAL DATA:  Weakness, weight loss EXAM: CHEST - 2 VIEW COMPARISON:  01/11/2019 FINDINGS: The heart size and mediastinal contours are within normal limits. Both lungs are clear. The visualized skeletal structures are unremarkable. IMPRESSION: No active cardiopulmonary disease. Electronically Signed   By: Marlan Palau M.D.   On: 02/04/2019 10:06    Procedures Procedures (including critical care time)  Medications Ordered in ED Medications - No data to display   Initial Impression / Assessment and Plan / ED Course  I have reviewed the triage vital signs and the nursing notes.  Pertinent labs & imaging results that were available during my care of the patient were reviewed by me and considered in my medical decision making (see chart for details).         PT with at least a month of vague weakness and feeling dehydrated although previous work ups have been reassuring. VS normal, EKG similar to previous. CBC normal, unremarkable CMP, lipase, troponin, and UA. TSH elevated at 5.17, FT4 low at 0.72.  Her symptoms of weight loss would actually suggest hyperthyroidism but nonetheless, given these abnormal values, I have recommended that she contact her PCP for appointment this week to discuss medication adjustment.  She admits to me that she sometimes forgets to take her medication.  I see no other other evidence of dehydration, anemia, or acute infectious process to  explain the patient's symptoms.  I have extensively reviewed return precautions with her and she is voiced understanding.   Sonya Gross was evaluated in Emergency Department on 02/04/2019 for the symptoms described in the history of present illness. She was evaluated in the context of the global COVID-19 pandemic, which necessitated consideration that the patient might be at risk for infection with the SARS-CoV-2 virus that causes COVID-19. Institutional protocols and algorithms that pertain to the evaluation of patients at risk for COVID-19 are in a state of rapid change based on information released by regulatory bodies including the CDC and federal and state organizations. These policies and algorithms were followed during the patient's care in the ED.  Final Clinical Impressions(s) / ED Diagnoses   Final diagnoses:  Malaise  Hypothyroidism, unspecified type    ED Discharge Orders    None       , Ambrose Finland, MD 02/04/19 1155

## 2019-03-18 ENCOUNTER — Other Ambulatory Visit: Payer: Self-pay

## 2019-03-18 ENCOUNTER — Encounter (HOSPITAL_COMMUNITY): Payer: Self-pay

## 2019-03-18 ENCOUNTER — Ambulatory Visit (HOSPITAL_COMMUNITY)
Admission: EM | Admit: 2019-03-18 | Discharge: 2019-03-18 | Disposition: A | Discharge status: Home / Self Care | Payer: Medicare Other | Attending: Family Medicine | Admitting: Family Medicine

## 2019-03-18 DIAGNOSIS — N390 Urinary tract infection, site not specified: Secondary | ICD-10-CM | POA: Diagnosis not present

## 2019-03-18 DIAGNOSIS — N3000 Acute cystitis without hematuria: Secondary | ICD-10-CM | POA: Diagnosis present

## 2019-03-18 LAB — POCT URINALYSIS DIP (DEVICE)
Bilirubin Urine: NEGATIVE
Glucose, UA: NEGATIVE mg/dL
Hgb urine dipstick: NEGATIVE
Ketones, ur: NEGATIVE mg/dL
Nitrite: NEGATIVE
Protein, ur: NEGATIVE mg/dL
Specific Gravity, Urine: >=1.03 (ref 1.005–1.030)
Urobilinogen, UA: 0.2 mg/dL (ref 0.0–1.0)
pH: 5.5 (ref 5.0–8.0)

## 2019-03-18 MED ORDER — NITROFURANTOIN MONOHYD MACRO 100 MG PO CAPS: 100.0000 mg | ORAL_CAPSULE | Freq: Two times a day (BID) | ORAL | 0 refills | Status: DC

## 2019-03-18 NOTE — ED Provider Notes (Signed)
[facility]    CSN: [id number] Arrival date & time: 03/18/19  1705     History   Chief Complaint Chief Complaint  Patient presents with  . Urinary Tract Infection    HPI Sonya Gross is a 85 y.o. female any for acute concern of UTI.  Patient is endorsing urinary frequency and "bladder pressure" for the last 2 days.  Patient does have remote history of UTI, last seen for this in 2017: Was given 7-day course of Macrobid, which patient states she tolerated well and provided adequate relief of symptoms.  Patient denies fever, chills, myalgias, confusion, hallucinations, abdominal pain, pelvic pain, vaginal discharge or malodor.      Past Medical History:  Diagnosis Date  . Cataract   . Hyperlipidemia     Patient Active Problem List   Diagnosis Date Noted  . Hypothyroidism 02/26/2016  . Hyperlipidemia 12/05/2015    Past Surgical History:  Procedure Laterality Date  . ABDOMINAL HYSTERECTOMY      OB History   No obstetric history on file.      Home Medications    Prior to Admission medications   Medication Sig Start Date End Date Taking? Authorizing Provider  albuterol (VENTOLIN HFA) 108 (90 Base) MCG/ACT inhaler Inhale 2 puffs into the lungs every 4 (four) hours as needed for wheezing or shortness of breath.  01/05/19   [provider]  alendronate (FOSAMAX) 70 MG tablet Take 70 mg by mouth every Monday.    [provider]  aspirin EC 81 MG tablet Take 81 mg by mouth daily.    [provider]  bimatoprost (LUMIGAN) 0.01 % SOLN Place 1 drop into the right eye at bedtime.    [provider]  budesonide-formoterol (SYMBICORT) 80-4.5 MCG/ACT inhaler Inhale 2 puffs into the lungs 2 (two) times a day.    [provider]  Fenofibrate 40 MG TABS Take 40 mg by mouth daily.  11/15/18   [provider]  levocetirizine (XYZAL) 5 MG tablet Take 5 mg by mouth daily. 12/21/18   [provider]  levothyroxine  (SYNTHROID) 25 MCG tablet Take 25 mcg by mouth daily. 12/14/18   [provider]  meclizine (ANTIVERT) 25 MG tablet Take 25 mg by mouth 3 (three) times daily as needed for dizziness.     [provider]  montelukast (SINGULAIR) 10 MG tablet Take 10 mg by mouth daily. 01/20/19   [provider]  nitrofurantoin, macrocrystal-monohydrate, (MACROBID) 100 MG capsule Take 1 capsule (100 mg total) by mouth 2 (two) times daily. 03/18/19   Hall-Potvin, [place], PA-C  rosuvastatin (CRESTOR) 20 MG tablet Take 20 mg by mouth daily.    [provider]  triamterene-hydrochlorothiazide (DYAZIDE) 37.5-25 MG capsule Take 1 capsule by mouth daily.    [provider]  vitamin C (ASCORBIC ACID) 500 MG tablet Take 1,000 mg by mouth daily.    [provider]  zinc gluconate 50 MG tablet Take 50 mg by mouth daily.    [provider]    Family History Family History  Problem Relation Age of Onset  . Cancer Mother   . Healthy Father   . Mental illness Sister     Social History Social History   Tobacco Use  . Smoking status: Never Smoker  . Smokeless tobacco: Never Used  Substance Use Topics  . Alcohol use: Never    Alcohol/week: 0.0 standard drinks    Frequency: Never  . Drug use: Never  Allergies   Penicillins   Review of Systems As per HPI   Physical Exam Triage Vital Signs ED Triage Vitals  Enc Vitals Group     BP 03/18/19 1753 (!) 144/76     Pulse Rate 03/18/19 1753 84     Resp 03/18/19 1753 18     Temp 03/18/19 1753 98.2 F (36.8 C)     Temp Source 03/18/19 1753 Oral     SpO2 03/18/19 1753 100 %     Weight --      Height --      Head Circumference --      Peak Flow --      Pain Score 03/18/19 1752 0     Pain Loc --      Pain Edu? --      Excl. in GC? --    No data found.  Updated Vital Signs BP (!) 144/76 (BP Location: Left Arm)   Pulse 84   Temp 98.2 F (36.8 C) (Oral)   Resp 18   SpO2 100%   Visual Acuity  Right Eye Distance:   Left Eye Distance:   Bilateral Distance:    Right Eye Near:   Left Eye Near:    Bilateral Near:     Physical Exam Constitutional:      General: She is not in acute distress. HENT:     Head: Normocephalic and atraumatic.     Mouth/Throat:     Mouth: Mucous membranes are moist.     Pharynx: Oropharynx is clear. No oropharyngeal exudate or posterior oropharyngeal erythema.  Eyes:     General: No scleral icterus.       Right eye: No discharge.        Left eye: No discharge.     Conjunctiva/sclera: Conjunctivae normal.     Pupils: Pupils are equal, round, and reactive to light.  Neck:     Musculoskeletal: Normal range of motion and neck supple. No muscular tenderness.  Cardiovascular:     Rate and Rhythm: Normal rate.  Pulmonary:     Effort: Pulmonary effort is normal.  Abdominal:     General: Abdomen is flat. Bowel sounds are normal. There is no distension.     Tenderness: There is no abdominal tenderness. There is no right CVA tenderness or left CVA tenderness.  Lymphadenopathy:     Cervical: No cervical adenopathy.  Skin:    General: Skin is warm.     Capillary Refill: Capillary refill takes less than 2 seconds.     Coloration: Skin is not jaundiced or pale.     Findings: No bruising or erythema.  Neurological:     General: No focal deficit present.     Mental Status: She is alert and oriented to person, place, and time.      UC Treatments / Results  Labs (all labs ordered are listed, but only abnormal results are displayed) Labs Reviewed  POCT URINALYSIS DIP (DEVICE) - Abnormal; Notable for the following components:      Result Value   Leukocytes,Ua SMALL (*)    All other components within normal limits  URINE CULTURE    EKG   Radiology No results found.  Procedures Procedures (including critical care time)  Medications Ordered in UC Medications - No data to display  Initial Impression / Assessment and Plan / UC Course  I have  reviewed the triage vital signs and the nursing notes.  Pertinent labs & imaging results that were available during my  care of the patient were reviewed by me and considered in my medical decision making (see chart for details).     85 year old female with remote history of UTI, presenting for similar symptoms.  Patient appears well, reports tolerance to, and resolution of previous UTI with Macrobid.  As of 5/23, GFR was greater than 60 and creatinine was stable at 0.75.  Will treat with Macrobid, discussed strict return precautions.  Patient verbalized understanding and is agreement with plan.   Final Clinical Impressions(s) / UC Diagnoses   Final diagnoses:  Acute cystitis without hematuria     Discharge Instructions     Biotic as directed. Return if you develop fever, body aches, chills, weakness, abdominal pain, vaginal or pelvic pain, blood in your urine.    ED Prescriptions    Medication Sig Dispense Auth. Provider   nitrofurantoin, macrocrystal-monohydrate, (MACROBID) 100 MG capsule Take 1 capsule (100 mg total) by mouth 2 (two) times daily. 10 capsule Hall-Potvin, [place], PA-C     Controlled Substance Prescriptions [place] Controlled Substance Registry consulted? Not Applicable   Shea EvansHall-Potvin, , [place]PA-C 03/18/19 1939

## 2019-03-18 NOTE — Discharge Instructions (Addendum)
Biotic as directed. Return if you develop fever, body aches, chills, weakness, abdominal pain, vaginal or pelvic pain, blood in your urine.

## 2019-03-18 NOTE — ED Triage Notes (Signed)
Patient presents to Urgent Care with complaints of frequent urination and lower abdominal pain.

## 2019-03-19 LAB — URINE CULTURE

## 2019-04-03 ENCOUNTER — Ambulatory Visit (INDEPENDENT_AMBULATORY_CARE_PROVIDER_SITE_OTHER): Payer: Medicare Other | Admitting: Cardiology

## 2019-04-03 ENCOUNTER — Encounter: Payer: Self-pay | Admitting: Cardiology

## 2019-04-03 ENCOUNTER — Other Ambulatory Visit: Payer: Self-pay

## 2019-04-03 VITALS — BP 124/72 | Ht 66.0 in | Wt 121.0 lb

## 2019-04-03 DIAGNOSIS — R42 Dizziness and giddiness: Secondary | ICD-10-CM

## 2019-04-03 DIAGNOSIS — I452 Bifascicular block: Secondary | ICD-10-CM | POA: Diagnosis not present

## 2019-04-03 DIAGNOSIS — R002 Palpitations: Secondary | ICD-10-CM

## 2019-04-03 DIAGNOSIS — I10 Essential (primary) hypertension: Secondary | ICD-10-CM

## 2019-04-03 DIAGNOSIS — I351 Nonrheumatic aortic (valve) insufficiency: Secondary | ICD-10-CM | POA: Diagnosis not present

## 2019-04-03 HISTORY — DX: Bifascicular block: I45.2

## 2019-04-03 HISTORY — DX: Nonrheumatic aortic (valve) insufficiency: I35.1

## 2019-04-03 HISTORY — DX: Essential (primary) hypertension: I10

## 2019-04-03 NOTE — Progress Notes (Signed)
Virtual Visit via Video Note: This visit type was conducted due to national recommendations for restrictions regarding the COVID-19 Pandemic (e.g. social distancing).  This format is felt to be most appropriate for this patient at this time.  All issues noted in this document were discussed and addressed.  No physical exam was performed (except for noted visual exam findings with Telehealth visits).  The patient has consented to conduct a Telehealth visit and understands insurance will be billed.   I connected with@, on 04/03/19 at  by a video enabled telemedicine application and verified that I am speaking with the correct person using two identifiers.   I discussed the limitations of evaluation and management by telemedicine and the availability of in person appointments. The patient expressed understanding and agreed to proceed.   I have discussed with patient regarding the safety during COVID Pandemic and steps and precautions to be taken including social distancing, frequent hand wash and use of detergent soap, gels with the patient. I asked the patient to avoid touching mouth, nose, eyes, ears with the hands. I encouraged regular walking around the neighborhood and exercise and regular diet, as long as social distancing can be maintained.  Primary Physician/Referring:  Willey Blade, MD  Patient ID: Sonya Gross, female    DOB: [birth year], 85 y.o.   MRN: [id number]  Chief Complaint  Patient presents with  . Dizziness  . Follow-up   HPI:    HPI: Sonya Gross  is a 85 y.o. female   pleasant African-American female with mild to moderate aortic regurgitation, bifascicular block on the EKG, hypertension, mixed hyperlipidemia presents here for annual visit. She essentially remains asymptomatic, [facility] and exercises fairly at least 3 times or 4 times a week. No shortness of breath, palpitations, dizziness or syncope. No chest pain.   In April 2020 started to have  dizziness and dyspnea and was seen in the ED. Treaded for urosepsis. She continues to have dizziness and palpitations since. Notices palpitations in the mornings and lasts 20 min almost daily. She also c/o not feeling well overall and has also started having "flashes" especially after she eats or take medications. No syncope. Dizziness persists irrespective of position of her body, feels dizziness both while sitting or standing.  No recent weight changes.  No leg edema.  No hemoptysis.  Past Medical History:  Diagnosis Date  . Bifascicular block 04/03/2019  . Cataract   . HTN (hypertension) 04/03/2019  . Hyperlipidemia   . Nonrheumatic aortic (valve) insufficiency 04/03/2019  . Thyroid disease    Past Surgical History:  Procedure Laterality Date  . ABDOMINAL HYSTERECTOMY    . APPENDECTOMY  1975  . CATARACT EXTRACTION, BILATERAL  2010  . CHOLECYSTECTOMY  2003   Social History   Socioeconomic History  . Marital status: Married    Spouse name: Not on file  . Number of children: 2  . Years of education: Not on file  . Highest education level: Not on file  Occupational History  . Not on file  Social Needs  . Financial resource strain: Not on file  . Food insecurity    Worry: Not on file    Inability: Not on file  . Transportation needs    Medical: Not on file    Non-medical: Not on file  Tobacco Use  . Smoking status: Never Smoker  . Smokeless tobacco: Never Used  Substance and Sexual Activity  . Alcohol use: Never    Alcohol/week:  0.0 standard drinks    Frequency: Never  . Drug use: Never  . Sexual activity: Not on file  Lifestyle  . Physical activity    Days per week: Not on file    Minutes per session: Not on file  . Stress: Not on file  Relationships  . Social Herbalist on phone: Not on file    Gets together: Not on file    Attends religious service: Not on file    Active member of club or organization: Not on file    Attends meetings of clubs or  organizations: Not on file    Relationship status: Not on file  . Intimate partner violence    Fear of current or ex partner: Not on file    Emotionally abused: Not on file    Physically abused: Not on file    Forced sexual activity: Not on file  Other Topics Concern  . Not on file  Social History Narrative  . Not on file   ROS  Review of Systems  Constitution: Negative for chills, decreased appetite, malaise/fatigue and weight gain.  Cardiovascular: Negative for dyspnea on exertion, leg swelling and syncope.  Endocrine: Negative for cold intolerance.  Hematologic/Lymphatic: Does not bruise/bleed easily.  Musculoskeletal: Negative for joint swelling.  Gastrointestinal: Negative for abdominal pain, anorexia, change in bowel habit, hematochezia and melena.  Neurological: Negative for headaches and light-headedness.  Psychiatric/Behavioral: Negative for depression and substance abuse.  All other systems reviewed and are negative.  Objective  Blood pressure 124/72, height _0  (1.676 m), weight 121 lb (54.9 kg). Body mass index is 19.53 kg/m.  Physical exam not performed or limited due to virtual visit.  Patient appeared to be in no distress, Neck was supple, respiration was not labored.  Please see exam details from prior visit is as below.  Physical Exam  Constitutional: She appears well-developed and well-nourished. No distress.  HENT:  Head: Atraumatic.  Eyes: Conjunctivae are normal.  Neck: Neck supple. No JVD present. No thyromegaly present.  Cardiovascular: Normal rate, regular rhythm and intact distal pulses. Exam reveals no gallop.  Murmur heard. High-pitched decrescendo early diastolic murmur is present with a grade of 2/6 at the upper right sternal border radiating to the apex. S1 is normal, S2 physiologic splitting present.  There is no leg edema.  Pulmonary/Chest: Effort normal and breath sounds normal.  Abdominal: Soft. Bowel sounds are normal.  Musculoskeletal:  Normal range of motion.        General: No edema.  Neurological: She is alert.  Skin: Skin is warm and dry.  Psychiatric: She has a normal mood and affect.   Radiology: No results found.  Laboratory examination:   Labs 03/07/2019: Serum glucose 95 mg, BUN 13, creatinine 0.69, EGFR greater than 60 mL, CMP otherwise normal.  A1c 6.0%.  Sedimentation rate 10 mm/h. Total cholesterol 206, triglycerides 79, HDL 56, LDL 134.  LDL particle #1545. TSH 4.660, T4 normal at 1.10.  T3 2.7, normal.  CMP Latest Ref Rng & Units 02/04/2019 12/27/2018 12/25/2018  Glucose 70 - 99 mg/dL 96 135(H) 115(H)  BUN 8 - 23 mg/dL _1 Creatinine 0.44 - 1.00 mg/dL 0.75 0.97 0.72  Sodium 135 - 145 mmol/L 138 139 139  Potassium 3.5 - 5.1 mmol/L 3.8 3.8 3.8  Chloride 98 - 111 mmol/L 102 105 102  CO2 22 - 32 mmol/L _2 Calcium 8.9 - 10.3 mg/dL 10.2 9.8 9.5  Total Protein  6.5 - 8.1 g/dL 7.2 - 7.9  Total Bilirubin 0.3 - 1.2 mg/dL 1.5(H) - 1.1  Alkaline Phos 38 - 126 U/L 45 - 49  AST 15 - 41 U/L 24 - 23  ALT 0 - 44 U/L 19 - 22   CBC Latest Ref Rng & Units 02/04/2019 12/27/2018 12/25/2018  WBC 4.0 - 10.5 K/uL 5.0 5.2 4.7  Hemoglobin 12.0 - 15.0 g/dL 15.0 14.7 15.9(H)  Hematocrit 36.0 - 46.0 % 45.9 45.8 48.0(H)  Platelets 150 - 400 K/uL 173 176 173   Lipid Panel  No results found for: CHOL, TRIG, HDL, CHOLHDL, VLDL, LDLCALC, LDLDIRECT HEMOGLOBIN A1C No results found for: HGBA1C, MPG TSH Recent Labs    02/04/19 0909  TSH 5.166*   Medications   Current Outpatient Medications  Medication Instructions  . albuterol (VENTOLIN HFA) 108 (90 Base) MCG/ACT inhaler 2 puffs, Inhalation, Every 4 hours PRN  . alendronate (FOSAMAX) 70 mg, Oral, Every Mon  . aspirin EC 81 mg, Oral, Daily  . bimatoprost (LUMIGAN) 0.01 % SOLN 1 drop, Right Eye, Daily at bedtime  . budesonide-formoterol (SYMBICORT) 80-4.5 MCG/ACT inhaler 2 puffs, Inhalation, 2 times daily  . Ergocalciferol (VITAMIN D2 PO) Oral, Daily  .  levothyroxine (SYNTHROID) 25 mcg, Oral, Daily  . meclizine (ANTIVERT) 25 mg, Oral, 3 times daily PRN  . rosuvastatin (CRESTOR) 20 mg, Oral, Daily  . vitamin C (ASCORBIC ACID) 1,000 mg, Oral, Daily  . zinc gluconate 50 mg, Oral, Daily   Cardiac Studies:   Echocardiogram 08/11/2016: Left ventricle cavity is normal in size. Normal global wall motion. Normal diastolic filling pattern, normal LAP. Calculated EF 60%. Mild aortic valve leaflet calcification. Mild to moderate aortic regurgitation. Trace tricuspid regurgitation. No evidence of pulmonary hypertension. Mild pulmonic regurgitation. Mild atherosclerotic changes in the aorta. The aortic root is normal. Compared to the study done on 08/01/2015, no significant change.  Treadmill stress test [07/29/2015]: Indication:RBBB, Abnormal ECG The patient exercised according to Bruce Protocol, Total time recorded 3:01 min achieving max heart rate of 132 which was 96% of MPHR for age and 4.64 METS of work. Normal BP response. Resting ECG showing a NSR with a RBBB. There was no ST-T changes of ischemia with exercise stress test. Stress terminated due to fatigue and THR >85% MPHR met. Rare PVC. Markedly reduced exercise capacity and aerobic threshold.  Assessment     ICD-10-CM   1. Dizziness  R42   2. Palpitations  R00.2 PCV ECHOCARDIOGRAM COMPLETE    CARDIAC EVENT MONITOR  3. Moderate aortic regurgitation  I35.1 PCV ECHOCARDIOGRAM COMPLETE  4. Bifascicular block  I45.2 CARDIAC EVENT MONITOR  5. Essential hypertension  I10   6. Nonrheumatic aortic (valve) insufficiency  I35.1     EKG 10/07/2018: Normal sinus rhythm at the rate of 88 bpm, left axis deviation, left anterior fascicular block. Right bundle branch block. Low-voltage complexes. No significant change from EKG 08/17/2017:  Recommendations:   Mrs. Sonya Gross is a pleasant 85 year old African-American female with mild to moderate aortic regurgitation, bifascicular block on the EKG,  hypertension, mixed hyperlipidemia presents here for annual visit.  She was treated for UTI and presented to the emergency room in April 2020 since then has noticed marked dyspnea irrespective of a position, but no syncope.  She is also having palpitations.  Sinus node disease and also AV nodal disease is a good potential possibility.  She needs an event monitor for 2 weeks, I will also repeat echocardiogram in view of moderate aortic regurgitation noted  2 to 3 years ago.  I will see her back after the test.  Adrian Prows, MD, [facility] 04/03/2019, 8:09 PM [facility] [place] Pager: [phone] Office: [phone] If no answer Cell [phone]

## 2019-04-04 ENCOUNTER — Other Ambulatory Visit: Payer: Self-pay | Admitting: Internal Medicine

## 2019-04-04 ENCOUNTER — Other Ambulatory Visit: Payer: Self-pay

## 2019-04-04 ENCOUNTER — Ambulatory Visit: Payer: Medicare Other

## 2019-04-04 DIAGNOSIS — I452 Bifascicular block: Secondary | ICD-10-CM

## 2019-04-04 DIAGNOSIS — R002 Palpitations: Secondary | ICD-10-CM | POA: Diagnosis not present

## 2019-04-04 DIAGNOSIS — E039 Hypothyroidism, unspecified: Secondary | ICD-10-CM

## 2019-04-05 ENCOUNTER — Other Ambulatory Visit: Payer: Self-pay | Admitting: Internal Medicine

## 2019-04-05 DIAGNOSIS — R519 Headache, unspecified: Secondary | ICD-10-CM

## 2019-04-10 ENCOUNTER — Ambulatory Visit
Admission: RE | Admit: 2019-04-10 | Discharge: 2019-04-10 | Disposition: A | Discharge status: Home / Self Care | Payer: Medicare Other | Source: Ambulatory Visit | Attending: Internal Medicine | Admitting: Internal Medicine

## 2019-04-10 DIAGNOSIS — E039 Hypothyroidism, unspecified: Secondary | ICD-10-CM

## 2019-04-13 ENCOUNTER — Other Ambulatory Visit (HOSPITAL_COMMUNITY): Payer: Self-pay | Admitting: *Deleted

## 2019-04-14 ENCOUNTER — Other Ambulatory Visit: Payer: Self-pay

## 2019-04-14 ENCOUNTER — Ambulatory Visit (HOSPITAL_COMMUNITY)
Admission: RE | Admit: 2019-04-14 | Discharge: 2019-04-14 | Disposition: A | Discharge status: Home / Self Care | Payer: Medicare Other | Source: Ambulatory Visit | Attending: Internal Medicine | Admitting: Internal Medicine

## 2019-04-14 DIAGNOSIS — E0590 Thyrotoxicosis, unspecified without thyrotoxic crisis or storm: Secondary | ICD-10-CM | POA: Insufficient documentation

## 2019-04-14 DIAGNOSIS — R42 Dizziness and giddiness: Secondary | ICD-10-CM | POA: Diagnosis not present

## 2019-04-14 LAB — ACTH STIMULATION, 3 TIME POINTS
Cortisol, 30 Min: 26.5 ug/dL
Cortisol, 60 Min: 31.9 ug/dL
Cortisol, Base: 17.2 ug/dL

## 2019-04-14 MED ORDER — COSYNTROPIN 0.25 MG IJ SOLR: 0.2500 mg | Freq: Once | INTRAMUSCULAR | Status: DC

## 2019-04-14 MED ORDER — COSYNTROPIN 0.25 MG IJ SOLR
INTRAMUSCULAR | Status: AC
  Administered 2019-04-14: 09:00:00 0.25 mg
  Filled 2019-04-14: qty 0.25

## 2019-04-14 NOTE — Discharge Instructions (Signed)
ACTH Stimulation Test °Why am I having this test? °The adrenocorticotropic hormone (ACTH) stimulation test is used to measure how well your adrenal glands are working. °What is being tested? °This test checks the levels of cortisol in your blood before and after your adrenal glands are stimulated with ACTH. ACTH is produced by a gland in your brain called the pituitary gland. ACTH stimulates your two adrenal glands, which are located above each kidney. The adrenal glands produce hormones that are released into the blood. One of these hormones is cortisol. Cortisol helps your body to respond to stress. If your adrenal glands are not working well and are not responding to ACTH properly, the test result will show too little cortisol. °What kind of sample is taken? ° °Two or more blood samples are required for this test. The samples are usually collected by inserting a needle into a blood vessel. °How do I prepare for this test? °· Do not eat or drink anything after midnight on the night before the test or as directed by your health care provider. You may continue to drink water up until the time of your test. °· You may be instructed to avoid certain medicines that can affect cortisol levels, such as those containing estrogen or steroids. Make sure that the health care provider ordering the test is aware of any recent use of steroid hormones, such as prednisone or cortisone injections. °· Follow any additional instructions as directed by your health care provider. °What happens during the test? °This test is usually done in the morning, as your cortisol levels change throughout the day. °1. The first blood sample will be collected. Cortisol will be measured in this sample to provide your starting (baseline) level. °2. You will be given cosyntropin by injection or through an IV. Cosyntropin is similar to ACTH and should cause the adrenal glands to release cortisol into the bloodstream. °? You may feel a slight flush  after the cosyntropin is given. This is normal. °3. One or more blood samples will be taken at specified intervals (30 or 60 minutes after the cosyntropin injection) in order to measure your cortisol levels after the adrenal gland is stimulated. °4. The test results will be compared to show the amount of cortisol in your blood before and after you were given cosyntropin. °How are the results reported? °Your test results will be reported as values. Your health care provider will compare your results to normal ranges that were established after testing a large group of people (reference ranges). Reference ranges may vary among labs and hospitals. For this test, a common reference range is: °· A baseline cortisol level from 7 mcg/dL to 10 mcg/dL, reaching at least 18 mcg/dL at 60 minutes after stimulation. °What do the results mean? °Results outside the reference range may indicate that you have: °· Adrenal insufficiency. This can be caused by a problem in the adrenal gland itself (primary adrenal insufficiency) or by a problem outside the adrenal gland (secondary adrenal insufficiency). °? Causes of primary adrenal insufficiency include autoimmune inflammation of the adrenal gland, infection involving the adrenal gland, a tumor that has spread to the adrenal gland, and bleeding into the adrenal gland. °? Causes of secondary adrenal insufficiency include conditions that cause the pituitary gland to function less than normal (hypopituitarism). This may be due to a specific disease involving the pituitary gland or the use of high-dose steroid medications to treat another medical condition. °Other tests may be needed to find the cause of   adrenal gland conditions and confirm a diagnosis. °Talk with your health care provider about what your results mean. °Questions to ask your health care provider °Ask your health care provider, or the department that is doing the test: °· When will my results be ready? °· How will I get my  results? °· What are my treatment options? °· What other tests do I need? °· What are my next steps? °Summary °· The ACTH stimulation test is used to measure how well your adrenal glands are working. °· The test has three steps. First, your blood is tested to measure your baseline cortisol level. Second, you are given cosyntropin to stimulate your adrenal glands to release cortisol. Third, your blood is tested to see how much cortisol is in your blood after stimulation. °· Results outside the normal range may indicate that you have a condition that affects how well your adrenal glands function. °This information is not intended to replace advice given to you by your health care provider. Make sure you discuss any questions you have with your health care provider. °Document Released: 10/03/2010 Document Revised: 12/21/2018 Document Reviewed: 05/04/2017 °Elsevier Patient Education © 2020 [employer] ° °

## 2019-04-15 LAB — ACTH: C206 ACTH: 33.8 pg/mL (ref 7.2–63.3)

## 2019-04-17 ENCOUNTER — Ambulatory Visit
Admission: RE | Admit: 2019-04-17 | Discharge: 2019-04-17 | Disposition: A | Discharge status: Home / Self Care | Payer: Medicare Other | Source: Ambulatory Visit | Attending: Internal Medicine | Admitting: Internal Medicine

## 2019-04-17 DIAGNOSIS — R519 Headache, unspecified: Secondary | ICD-10-CM

## 2019-04-17 MED ORDER — IOPAMIDOL (ISOVUE-300) INJECTION 61%
75.0000 mL | Freq: Once | INTRAVENOUS | Status: AC | PRN
  Administered 2019-04-17: 75 mL via INTRAVENOUS

## 2019-04-26 ENCOUNTER — Other Ambulatory Visit: Payer: Medicare Other

## 2019-05-12 ENCOUNTER — Other Ambulatory Visit: Payer: Self-pay

## 2019-05-12 ENCOUNTER — Ambulatory Visit (INDEPENDENT_AMBULATORY_CARE_PROVIDER_SITE_OTHER): Payer: Medicare Other

## 2019-05-12 DIAGNOSIS — I351 Nonrheumatic aortic (valve) insufficiency: Secondary | ICD-10-CM | POA: Diagnosis not present

## 2019-05-12 DIAGNOSIS — R002 Palpitations: Secondary | ICD-10-CM | POA: Diagnosis not present

## 2019-05-15 ENCOUNTER — Other Ambulatory Visit: Payer: Self-pay

## 2019-05-15 ENCOUNTER — Ambulatory Visit: Payer: Medicare Other | Admitting: Cardiology

## 2019-05-15 ENCOUNTER — Encounter: Payer: Self-pay | Admitting: Cardiology

## 2019-05-15 VITALS — BP 138/73 | HR 77 | Temp 97.8°F | Ht 64.0 in | Wt 119.0 lb

## 2019-05-15 DIAGNOSIS — R232 Flushing: Secondary | ICD-10-CM | POA: Diagnosis not present

## 2019-05-15 DIAGNOSIS — R002 Palpitations: Secondary | ICD-10-CM

## 2019-05-15 DIAGNOSIS — I351 Nonrheumatic aortic (valve) insufficiency: Secondary | ICD-10-CM

## 2019-05-15 DIAGNOSIS — R42 Dizziness and giddiness: Secondary | ICD-10-CM

## 2019-05-15 NOTE — Progress Notes (Signed)
Primary Physician/Referring:  Willey Blade, MD  Patient ID: Sonya Gross, female    DOB: 1930/04/20, 85 y.o.   MRN: [id number]  Chief Complaint  Patient presents with   Palpitations   Dizziness   HPI:    HPI: Sonya Gross  is a 85 y.o. female   pleasant African-American female with mild to moderate aortic regurgitation, bifascicular block on the EKG, hypertension, mixed hyperlipidemia, presents here for follow-up of dizziness and palpitations.  Symptoms of palpitations are remained stable, she does have occasional dizziness but no syncope. presents here for annual visit. She essentially remains asymptomatic, [facility] and [facility] fairly at least 3 times or 4 times a week. No shortness of breath, palpitations, dizziness or syncope. No chest pain.   In April 2020 started to have dizziness and dyspnea and was seen in the ED. Since then she started having "flashes" especially after she eats or take medications. No syncope. Dizziness persists irrespective of position of her body, feels dizziness both while sitting or standing.  No recent weight changes.  No leg edema.  No hemoptysis. Event monitor on 04/04/2019 was unrevealing and echocardiogram on 05/12/2019 revealed normal LVEF and moderate aortic regurgitation and mitral regurgitation without pulmonary hypertension.  Past Medical History:  Diagnosis Date   Bifascicular block 04/03/2019   Cataract    HTN (hypertension) 04/03/2019   Hyperlipidemia    Nonrheumatic aortic (valve) insufficiency 04/03/2019   Thyroid disease    Past Surgical History:  Procedure Laterality Date   ABDOMINAL HYSTERECTOMY     APPENDECTOMY  1975   CATARACT EXTRACTION, BILATERAL  2010   CHOLECYSTECTOMY  2003   Social History   Socioeconomic History   Marital status: Widowed    Spouse name: Not on file   Number of children: 2   Years of education: Not on file   Highest education level: Not on file  Occupational History     Not on file  Social Needs   Financial resource strain: Not on file   Food insecurity    Worry: Not on file    Inability: Not on file   Transportation needs    Medical: Not on file    Non-medical: Not on file  Tobacco Use   Smoking status: Never Smoker   Smokeless tobacco: Never Used  Substance and Sexual Activity   Alcohol use: Never    Alcohol/week: 0.0 standard drinks    Frequency: Never   Drug use: Never   Sexual activity: Not on file  Lifestyle   Physical activity    Days per week: Not on file    Minutes per session: Not on file   Stress: Not on file  Relationships   Social connections    Talks on phone: Not on file    Gets together: Not on file    Attends religious service: Not on file    Active member of club or organization: Not on file    Attends meetings of clubs or organizations: Not on file    Relationship status: Not on file   Intimate partner violence    Fear of current or ex partner: Not on file    Emotionally abused: Not on file    Physically abused: Not on file    Forced sexual activity: Not on file  Other Topics Concern   Not on file  Social History Narrative   Not on file   ROS  Review of Systems  Constitution: Negative for chills, decreased  appetite, malaise/fatigue and weight gain.  Cardiovascular: Positive for palpitations (occasional). Negative for dyspnea on exertion, leg swelling and syncope.  Endocrine: Negative for cold intolerance.  Hematologic/Lymphatic: Does not bruise/bleed easily.  Musculoskeletal: Negative for joint swelling.  Gastrointestinal: Negative for abdominal pain, anorexia, change in bowel habit, hematochezia and melena.  Neurological: Positive for dizziness. Negative for headaches.  Psychiatric/Behavioral: Negative for depression and substance abuse.  All other systems reviewed and are negative.  Objective  Blood pressure 138/73, pulse 77, temperature 97.8 F (36.6 C), height _0  (1.626 m), weight 119  lb (54 kg), SpO2 96 %. Body mass index is 20.43 kg/m.  Physical Exam  Constitutional: She appears well-developed and well-nourished. No distress.  HENT:  Head: Atraumatic.  Eyes: Conjunctivae are normal.  Neck: Neck supple. No JVD present. No thyromegaly present.  Cardiovascular: Normal rate, regular rhythm and intact distal pulses. Exam reveals no gallop.  Murmur heard. High-pitched decrescendo early diastolic murmur is present with a grade of 2/6 at the upper right sternal border radiating to the apex. S1 is normal, S2 physiologic splitting present.  There is no leg edema.  Pulmonary/Chest: Effort normal and breath sounds normal.  Abdominal: Soft. Bowel sounds are normal.  Musculoskeletal: Normal range of motion.        General: No edema.  Neurological: She is alert.  Skin: Skin is warm and dry.  Psychiatric: She has a normal mood and affect.   Radiology: No results found.  Laboratory examination:   Labs 03/07/2019: Serum glucose 95 mg, BUN 13, creatinine 0.69, EGFR greater than 60 mL, CMP otherwise normal.  A1c 6.0%.  Sedimentation rate 10 mm/h. Total cholesterol 206, triglycerides 79, HDL 56, LDL 134.  LDL particle #1545. TSH 4.660, T4 normal at 1.10.  T3 2.7, normal.  CMP Latest Ref Rng & Units 02/04/2019 12/27/2018 12/25/2018  Glucose 70 - 99 mg/dL 96 135(H) 115(H)  BUN 8 - 23 mg/dL _1 Creatinine 0.44 - 1.00 mg/dL 0.75 0.97 0.72  Sodium 135 - 145 mmol/L 138 139 139  Potassium 3.5 - 5.1 mmol/L 3.8 3.8 3.8  Chloride 98 - 111 mmol/L 102 105 102  CO2 22 - 32 mmol/L _2 Calcium 8.9 - 10.3 mg/dL 10.2 9.8 9.5  Total Protein 6.5 - 8.1 g/dL 7.2 - 7.9  Total Bilirubin 0.3 - 1.2 mg/dL 1.5(H) - 1.1  Alkaline Phos 38 - 126 U/L 45 - 49  AST 15 - 41 U/L 24 - 23  ALT 0 - 44 U/L 19 - 22   CBC Latest Ref Rng & Units 02/04/2019 12/27/2018 12/25/2018  WBC 4.0 - 10.5 K/uL 5.0 5.2 4.7  Hemoglobin 12.0 - 15.0 g/dL 15.0 14.7 15.9(H)  Hematocrit 36.0 - 46.0 % 45.9 45.8 48.0(H)    Platelets 150 - 400 K/uL 173 176 173   Lipid Panel  No results found for: CHOL, TRIG, HDL, CHOLHDL, VLDL, LDLCALC, LDLDIRECT HEMOGLOBIN A1C No results found for: HGBA1C, MPG TSH Recent Labs    02/04/19 0909  TSH 5.166*   Medications   Current Outpatient Medications  Medication Instructions   albuterol (VENTOLIN HFA) 108 (90 Base) MCG/ACT inhaler 2 puffs, Inhalation, Every 4 hours PRN   aspirin EC 81 mg, Oral, Daily   bimatoprost (LUMIGAN) 0.01 % SOLN 1 drop, Right Eye, Daily at bedtime   budesonide-formoterol (SYMBICORT) 80-4.5 MCG/ACT inhaler 2 puffs, Inhalation, 2 times daily   Ergocalciferol (VITAMIN D2 PO) Oral, Daily   levothyroxine (SYNTHROID) 25 mcg, Oral, Daily  rosuvastatin (CRESTOR) 20 mg, Oral, Daily   vitamin C (ASCORBIC ACID) 1,000 mg, Oral, Daily   Cardiac Studies:   Treadmill stress test [07/29/2015]: Indication:RBBB, Abnormal ECG The patient exercised according to Bruce Protocol, Total time recorded 3:01 min achieving max heart rate of 132 which was 96% of MPHR for age and 4.64 METS of work. Normal BP response. Resting ECG showing a NSR with a RBBB. There was no ST-T changes of ischemia with exercise stress test. Stress terminated due to fatigue and THR >85% MPHR met. Rare PVC. Markedly reduced exercise capacity and aerobic threshold.  Event monitor 04/04/2019 - 04/17/2019.  Baseline sample showed Sinus Rhythm w/Artifact with a heart rate of 84.2 bpm. There were 0 critical, 0 serious, and 39 stable events that occurred.  Symptomatic transmissions of dyspnea and lightheadedness revealed sinus rhythm/sinus tachycardia.  No other significant arrhythmias noted.  Echocardiogram 05/12/2019: Left ventricle cavity is normal in size. Normal left ventricular wall thickness. Normal LV systolic function with EF 61%. Normal global wall motion. Doppler evidence of grade I (impaired) diastolic dysfunction, normal LAP.  Trileaflet aortic valve. Mild aortic valve leaflet  calcification. Moderate (Grade II) aortic regurgitation. Moderate (Grade III) mitral regurgitation. Mild tricuspid regurgitation.  No evidence of pulmonary hypertension. Compared to previous study in 2017, mitral and tricuspid regurgitation increased in severity.   Assessment     ICD-10-CM   1. Dizziness  R42   2. Palpitations  R00.2 EKG 12-Lead  3. Hot flashes  R23.2   4. Moderate aortic regurgitation  I35.1     EKG 05/15/2039: Normal sinus rhythm at the rate of 75 bpm, left atrial enlargement, left axis deviation, left anterior fascicular block.  Right bundle branch block. No significant change from  EKG 10/07/2018, 10/18/2016:  Recommendations:   Mrs. Mika Griffitts is a pleasant 85 year old African-American female with mild to moderate aortic and mitral  regurgitation, bifascicular block on the EKG, hypertension, mixed hyperlipidemia presents here for follow-up of dizziness and palpitations.  Symptoms of palpitations are remained stable, she does have occasional dizziness but no syncope.  Patient's main complaint is hot flashes that occurs upper the chest area into the head, noticed more often after she eats but can happen at any time, last anywhere from 3-5 minutes.  Symptoms may suggest carcinoid syndrome, if her symptoms persist this needs to be worked up.  Her valvular heart disease including moderate mitral regurgitation and or aortic regurgitation does not explain her symptoms.  I do not suspect sinus node dysfunction or even nodal disease although she has bifascicular block on the EKG.  Blood pressure is well controlled, otherwise she is not in any acute decompensated heart failure, hence I'll see her back in 6 months.  We will request Dr. Heath Gold to keep an eye regarding possibility of carcinoid syndrome.  Adrian Prows, MD, [facility] 05/15/2019, 10:16 AM [facility]. [place] Pager: [phone] Office: [phone] If no answer Cell [phone]

## 2019-06-19 ENCOUNTER — Encounter: Payer: Self-pay | Admitting: *Deleted

## 2019-06-20 ENCOUNTER — Encounter: Payer: Self-pay | Admitting: Neurology

## 2019-06-20 ENCOUNTER — Ambulatory Visit: Payer: Medicare Other | Admitting: Neurology

## 2019-06-20 ENCOUNTER — Other Ambulatory Visit: Payer: Self-pay

## 2019-06-20 ENCOUNTER — Telehealth: Payer: Self-pay | Admitting: Neurology

## 2019-06-20 VITALS — BP 138/76 | HR 69 | Temp 97.7°F | Ht 64.0 in | Wt 121.0 lb

## 2019-06-20 DIAGNOSIS — R202 Paresthesia of skin: Secondary | ICD-10-CM

## 2019-06-20 DIAGNOSIS — H81399 Other peripheral vertigo, unspecified ear: Secondary | ICD-10-CM

## 2019-06-20 DIAGNOSIS — R232 Flushing: Secondary | ICD-10-CM | POA: Diagnosis not present

## 2019-06-20 DIAGNOSIS — R200 Anesthesia of skin: Secondary | ICD-10-CM

## 2019-06-20 NOTE — Telephone Encounter (Signed)
[employer] medicare order sent to [email or web address] auth they will reach out to the patient to schedule.  

## 2019-06-20 NOTE — Progress Notes (Signed)
PATIENT: Sonya Gross DOB: 1929-09-28  Chief Complaint  Patient presents with  . Dizziness    Lying: 138/76, 69, Sitting: 136/75, 68, Standing: 143/81, 76, Standing x 3 minutes: 137/75, 75.  She experiences dizziness nearly every time she stands up from a sitting position.  She is also concerned about a flushing feeling she has in her chest, neck and head.  The hot sensation seems to be worse after eating.   Marland Kitchen PCP    Willey Blade, MD     HISTORICAL  Sonya Gross is a 85 year old female, seen in request by her primary care physician Dr. Willey Blade for evaluation of dizziness, initial evaluation was June 20, 2019.  I have reviewed and summarized the referring note from the referring physician.  She had past medical history of hypothyroidism, on supplement, hyperlipidemia.  She lives alone, used to be highly functioning, since April 20, she began to have recurrent spells of dizziness, she describes her dizziness as sudden onset heart palpitations, sweating at her neck region, followed by high lightheadedness sensation, lasting 5 to 6 minutes, but since its onset, she often feels lightheadedness at sitting down, standing up position.  She was seen by cardiology Dr. Einar Gip May 15, 2019, EKG showed by fascicular block, mild to moderate aortic regurgitation,  She also had a cardiac monitoring on ES,04/04/2019 - 04/17/2019. Baseline sample showed Sinus Rhythm w/Artifact with a heart rate of 84.2 bpm.  There was no significant abnormality.  She does complains of intermittent bilateral lower extremity paresthesia,   REVIEW OF SYSTEMS: Full 14 system review of systems performed and notable only for as above All other review of systems were negative.  ALLERGIES: Allergies  Allergen Reactions  . Penicillins Swelling and Rash    Did it involve swelling of the face/tongue/throat, SOB, or low BP? No Did it involve sudden or severe rash/hives, skin peeling, or any  reaction on the inside of your mouth or nose? Yes Did you need to seek medical attention at a hospital or doctor's office? No When did it last happen?Over 10 years ago If all above answers are "NO", may proceed with cephalosporin use.     HOME MEDICATIONS: Current Outpatient Medications  Medication Sig Dispense Refill  . albuterol (VENTOLIN HFA) 108 (90 Base) MCG/ACT inhaler Inhale 2 puffs into the lungs every 4 (four) hours as needed for wheezing or shortness of breath.     . bimatoprost (LUMIGAN) 0.01 % SOLN Place 1 drop into the right eye at bedtime.    . budesonide-formoterol (SYMBICORT) 80-4.5 MCG/ACT inhaler Inhale 2 puffs into the lungs 2 (two) times a day.    . Cholecalciferol (VITAMIN D3 PO) Take 1 tablet by mouth daily.    . clobetasol cream (TEMOVATE) 4.09 % Apply 1 application topically as needed.    Marland Kitchen levothyroxine (SYNTHROID) 50 MCG tablet Take 50 mcg by mouth daily before breakfast.    . montelukast (SINGULAIR) 10 MG tablet Take 10 mg by mouth as needed.    . Multiple Vitamin (MULTIVITAMIN) tablet Take 1 tablet by mouth daily.    . rosuvastatin (CRESTOR) 20 MG tablet Take 20 mg by mouth daily.     No current facility-administered medications for this visit.     PAST MEDICAL HISTORY: Past Medical History:  Diagnosis Date  . Abnormal glucose   . Bifascicular block 04/03/2019  . Cataract   . Dizziness   . Dizziness   . Hot flashes   . HTN (hypertension) 04/03/2019  .  Hyperlipidemia   . Multiple nodules of lung   . Nonrheumatic aortic (valve) insufficiency 04/03/2019  . Osteopenia   . Palpitations   . Thyroid disease   . Vitamin D deficiency     PAST SURGICAL HISTORY: Past Surgical History:  Procedure Laterality Date  . ABDOMINAL HYSTERECTOMY    . APPENDECTOMY  1975  . CATARACT EXTRACTION, BILATERAL  2010  . CHOLECYSTECTOMY  2003    FAMILY HISTORY: Family History  Problem Relation Age of Onset  . Uterine cancer Mother        died at age 83  .  Healthy Father        died at age 83  . Mental illness Sister     SOCIAL HISTORY: Social History   Socioeconomic History  . Marital status: Widowed    Spouse name: Not on file  . Number of children: 2  . Years of education: college - masters in Building surveyorbiology  . Highest education level: Master's degree (e.g., MA, MS, MEng, MEd, MSW, MBA)  Occupational History  . Occupation: Retired   Engineer, productionocial Needs  . Financial resource strain: Not on file  . Food insecurity    Worry: Not on file    Inability: Not on file  . Transportation needs    Medical: Not on file    Non-medical: Not on file  Tobacco Use  . Smoking status: Never Smoker  . Smokeless tobacco: Never Used  Substance and Sexual Activity  . Alcohol use: Never    Alcohol/week: 0.0 standard drinks    Frequency: Never  . Drug use: Never  . Sexual activity: Not on file  Lifestyle  . Physical activity    Days per week: Not on file    Minutes per session: Not on file  . Stress: Not on file  Relationships  . Social Musicianconnections    Talks on phone: Not on file    Gets together: Not on file    Attends religious service: Not on file    Active member of club or organization: Not on file    Attends meetings of clubs or organizations: Not on file    Relationship status: Not on file  . Intimate partner violence    Fear of current or ex partner: Not on file    Emotionally abused: Not on file    Physically abused: Not on file    Forced sexual activity: Not on file  Other Topics Concern  . Not on file  Social History Narrative   Lives alone.   One cup caffeine use.   Right-handed.     PHYSICAL EXAM   Vitals:   06/20/19 1356  BP: 138/76  Pulse: 69  Temp: 97.7 F (36.5 C)  Weight: 121 lb (54.9 kg)  Height: 5\' 4"  (1.626 m)    Not recorded      Body mass index is 20.77 kg/m.  PHYSICAL EXAMNIATION:  Gen: NAD, conversant, well nourised, well groomed                     Cardiovascular: Regular rate rhythm, no peripheral  edema, warm, nontender. Eyes: Conjunctivae clear without exudates or hemorrhage Neck: Supple, no carotid bruits. Pulmonary: Clear to auscultation bilaterally   NEUROLOGICAL EXAM:  MENTAL STATUS: Speech:    Speech is normal; fluent and spontaneous with normal comprehension.  Cognition:     Orientation to time, place and person     Normal recent and remote memory     Normal Attention  span and concentration     Normal Language, naming, repeating,spontaneous speech     Fund of knowledge   CRANIAL NERVES: CN II: Visual fields are full to confrontation.  Pupils are round equal and briskly reactive to light. CN III, IV, VI: extraocular movement are normal. No ptosis. CN V: Facial sensation is intact to pinprick in all 3 divisions bilaterally. Corneal responses are intact.  CN VII: Face is symmetric with normal eye closure and smile. CN VIII: Hearing is normal to causal conversation. CN IX, X: Palate elevates symmetrically. Phonation is normal. CN XI: Head turning and shoulder shrug are intact CN XII: Tongue is midline with normal movements and no atrophy.  MOTOR: There is no pronator drift of out-stretched arms. Muscle bulk and tone are normal. Muscle strength is normal.  REFLEXES: Reflexes are 1 and symmetric at the biceps, triceps, knees, and ankles. Plantar responses are flexor.  SENSORY: Length dependent decreased to light touch, pinprick and vibratory sensation at ankle level  COORDINATION: Rapid alternating movements and fine finger movements are intact. There is no dysmetria on finger-to-nose and heel-knee-shin.    GAIT/STANCE: Posture is normal. Gait is steady with normal steps, base, arm swing, and turning. Heel and toe walking are normal. Tandem gait is normal.  Romberg is absent.   DIAGNOSTIC DATA (LABS, IMAGING, TESTING) - I reviewed patient records, labs, notes, testing and imaging myself where available.   ASSESSMENT AND PLAN  Sonya Gross is a 85 y.o.  female   Bilateral lower extremity paresthesia  EMG nerve conduction study to rule out peripheral neuropathy  Intermittent dizziness  MRI of the brain to rule out central nervous system structural lesion  Levert Feinstein, M.D. Ph.D.  [facility] [address] [place], [place] [address] Ph: [phone] Fax: [phone]  CC: Andi Devon, MD

## 2019-07-07 ENCOUNTER — Ambulatory Visit
Admission: RE | Admit: 2019-07-07 | Discharge: 2019-07-07 | Disposition: A | Discharge status: Home / Self Care | Payer: Medicare Other | Source: Ambulatory Visit | Attending: Neurology | Admitting: Neurology

## 2019-07-07 DIAGNOSIS — H81399 Other peripheral vertigo, unspecified ear: Secondary | ICD-10-CM

## 2019-07-10 ENCOUNTER — Telehealth: Payer: Self-pay | Admitting: Neurology

## 2019-07-10 NOTE — Telephone Encounter (Signed)
MRI brain showed age-related atrophy, moderate small vessel disease.  IMPRESSION: No acute intracranial abnormality  Age-appropriate atrophy. Moderate chronic microvascular ischemic changes  Image quality degraded by artifact from hair Braid.

## 2019-07-10 NOTE — Telephone Encounter (Signed)
I spoke to the patient and provided her with the results below. She verbalized understanding. 

## 2019-07-12 ENCOUNTER — Other Ambulatory Visit: Payer: Self-pay | Admitting: Internal Medicine

## 2019-07-12 DIAGNOSIS — R634 Abnormal weight loss: Secondary | ICD-10-CM

## 2019-07-12 DIAGNOSIS — R232 Flushing: Secondary | ICD-10-CM

## 2019-07-17 ENCOUNTER — Ambulatory Visit
Admission: RE | Admit: 2019-07-17 | Discharge: 2019-07-17 | Disposition: A | Discharge status: Home / Self Care | Payer: Medicare Other | Source: Ambulatory Visit | Attending: Internal Medicine | Admitting: Internal Medicine

## 2019-07-17 ENCOUNTER — Other Ambulatory Visit: Payer: Self-pay | Admitting: Internal Medicine

## 2019-07-17 DIAGNOSIS — R232 Flushing: Secondary | ICD-10-CM

## 2019-07-17 DIAGNOSIS — R634 Abnormal weight loss: Secondary | ICD-10-CM

## 2019-07-17 MED ORDER — IOPAMIDOL (ISOVUE-300) INJECTION 61%
100.0000 mL | Freq: Once | INTRAVENOUS | Status: AC | PRN
  Administered 2019-07-17: 14:00:00 100 mL via INTRAVENOUS

## 2019-08-02 ENCOUNTER — Other Ambulatory Visit: Payer: Self-pay

## 2019-08-02 ENCOUNTER — Ambulatory Visit (INDEPENDENT_AMBULATORY_CARE_PROVIDER_SITE_OTHER): Payer: Medicare Other | Admitting: Neurology

## 2019-08-02 ENCOUNTER — Ambulatory Visit: Payer: Medicare Other | Admitting: Neurology

## 2019-08-02 DIAGNOSIS — R200 Anesthesia of skin: Secondary | ICD-10-CM

## 2019-08-02 DIAGNOSIS — R202 Paresthesia of skin: Secondary | ICD-10-CM | POA: Diagnosis not present

## 2019-08-02 DIAGNOSIS — R42 Dizziness and giddiness: Secondary | ICD-10-CM

## 2019-08-02 DIAGNOSIS — R232 Flushing: Secondary | ICD-10-CM

## 2019-08-02 NOTE — Procedures (Signed)
Full Name: Aarushi Hemric Gender: Female MRN #: [id number] Date of Birth: [birth year]    Visit Date: 08/02/2019 10:04 Age: 85 Years 0 Months Old Examining Physician: Marcial Pacas, MD  Referring Physician: Marcial Pacas, MD History:  85 years old female complains of intermittent bilateral feet paresthesia, also dizziness when standing up  Summary of the tests: Nerve conduction study: Bilateral superficial peroneal and sural sensory responses were normal.  Bilateral tibial motor responses showed mildly decreased conduction velocity.  Bilateral peroneal to EDB motor responses were normal.  Electromyography:  Selected needle examination of left lower extremity muscles were normal.  Conclusion: This is a normal study.  There is no electrodiagnostic evidence of large fiber neuropathy or left lumbosacral radiculopathy.    ------------------------------- Marcial Pacas, M.D. PhD  [facility] [place], [place] [address] Tel: [phone] Fax: [phone]        [facility]    Nerve / Sites Muscle Latency Ref. Amplitude Ref. Rel Amp Segments Distance Velocity Ref. Area    ms ms mV mV %  cm m/s m/s mVms  R Peroneal - EDB     Ankle EDB 5.5 ?6.5 3.4 ?2.0 100 Ankle - EDB 9   9.7     Fib head EDB 12.4  3.2  94.7 Fib head - Ankle 30 44 ?44 8.6     Pop fossa EDB 14.7  3.1  98 Pop fossa - Fib head 10 44 ?44 8.7         Pop fossa - Ankle      L Peroneal - EDB     Ankle EDB 5.8 ?6.5 2.9 ?2.0 100 Ankle - EDB 9   9.6     Fib head EDB 12.7  2.4  84.1 Fib head - Ankle 30 44 ?44 8.8     Pop fossa EDB 14.9  2.3  96.8 Pop fossa - Fib head 10 44 ?44 13.2         Pop fossa - Ankle      R Tibial - AH     Ankle AH 4.8 ?5.8 9.4 ?4.0 100 Ankle - AH 9   21.7     Pop fossa AH 15.4  6.1  64.6 Pop fossa - Ankle 38 36 ?41 19.0  L Tibial - AH     Ankle AH 5.6 ?5.8 6.5 ?4.0 100 Ankle - AH 9   18.0     Pop fossa AH 15.5  4.1  63.5 Pop fossa - Ankle 37 37 ?41 12.9             SNC     Nerve / Sites Rec. Site Peak Lat Ref.  Amp Ref. Segments Distance    ms ms V V  cm  R Sural - Ankle (Calf)     Calf Ankle 3.9 ?4.4 8 ?6 Calf - Ankle 14  L Sural - Ankle (Calf)     Calf Ankle 3.8 ?4.4 6 ?6 Calf - Ankle 14  R Superficial peroneal - Ankle     Lat leg Ankle 3.9 ?4.4 6 ?6 Lat leg - Ankle 14  L Superficial peroneal - Ankle     Lat leg Ankle 3.8 ?4.4 5 ?6 Lat leg - Ankle 14              F  Wave    Nerve F Lat Ref.   ms ms  R Tibial - AH 55.9 ?56.0  L Tibial - AH 55.3 ?  56.0         EMG       EMG Summary Table    Spontaneous MUAP Recruitment  Muscle IA Fib PSW Fasc Other Amp Dur. Poly Pattern  R. Tibialis anterior Normal None None None _______ Normal Normal Normal Normal  R. Tibialis posterior Normal None None None _______ Normal Normal Normal Normal  R. Peroneus longus Normal None None None _______ Normal Normal Normal Normal  R. Gastrocnemius (Medial head) Normal None None None _______ Normal Normal Normal Normal  R. Vastus lateralis Normal None None None _______ Normal Normal Normal Normal

## 2019-08-02 NOTE — Progress Notes (Signed)
PATIENT: Sonya Gross DOB: 04/27/1930  No chief complaint on file.    HISTORICAL  Sonya Gross is a 85 year old female, seen in request by her primary care physician Dr. Andi DevonShelton, [place] for evaluation of dizziness, initial evaluation was June 20, 2019.  I have reviewed and summarized the referring note from the referring physician.  She had past medical history of hypothyroidism, on supplement, hyperlipidemia.  She lives alone, used to be highly functioning, since April 20, she began to have recurrent spells of dizziness, she describes her dizziness as sudden onset heart palpitations, sweating at her neck region, followed by high lightheadedness sensation, lasting 5 to 6 minutes, but since its onset, she often feels lightheadedness at sitting down, standing up position.  She was seen by cardiology Dr. Jacinto HalimGanji May 15, 2019, EKG showed by fascicular block, mild to moderate aortic regurgitation,  She also had a cardiac monitoring on ES,04/04/2019 - 04/17/2019. Baseline sample showed Sinus Rhythm w/Artifact with a heart rate of 84.2 bpm.  There was no significant abnormality.  She does complains of intermittent bilateral lower extremity paresthesia,  Update August 02, 2019: EMG nerve conduction study on August 02, 2019 was normal.  There is no evidence of large fiber peripheral neuropathy or left lumbosacral radiculopathy.  I personally reviewed MRI of the brain on July 07, 2019, age-appropriate atrophy, moderate supratentorium small vessel disease.  Laboratory evaluation showed mild elevated TSH 5.2, decreased free T4 0.7, normal CMP, CBC  REVIEW OF SYSTEMS: Full 14 system review of systems performed and notable only for as above All other review of systems were negative.  ALLERGIES: Allergies  Allergen Reactions  . Penicillins Swelling and Rash    Did it involve swelling of the face/tongue/throat, SOB, or low BP? No Did it involve sudden or severe  rash/hives, skin peeling, or any reaction on the inside of your mouth or nose? Yes Did you need to seek medical attention at a hospital or doctor's office? No When did it last happen?Over 10 years ago If all above answers are "NO", may proceed with cephalosporin use.     HOME MEDICATIONS: Current Outpatient Medications  Medication Sig Dispense Refill  . albuterol (VENTOLIN HFA) 108 (90 Base) MCG/ACT inhaler Inhale 2 puffs into the lungs every 4 (four) hours as needed for wheezing or shortness of breath.     . bimatoprost (LUMIGAN) 0.01 % SOLN Place 1 drop into the right eye at bedtime.    . budesonide-formoterol (SYMBICORT) 80-4.5 MCG/ACT inhaler Inhale 2 puffs into the lungs 2 (two) times a day.    . Cholecalciferol (VITAMIN D3 PO) Take 1 tablet by mouth daily.    . clobetasol cream (TEMOVATE) 0.05 % Apply 1 application topically as needed.    Marland Kitchen. levothyroxine (SYNTHROID) 50 MCG tablet Take 50 mcg by mouth daily before breakfast.    . montelukast (SINGULAIR) 10 MG tablet Take 10 mg by mouth as needed.    . Multiple Vitamin (MULTIVITAMIN) tablet Take 1 tablet by mouth daily.    . rosuvastatin (CRESTOR) 20 MG tablet Take 20 mg by mouth daily.     No current facility-administered medications for this visit.     PAST MEDICAL HISTORY: Past Medical History:  Diagnosis Date  . Abnormal glucose   . Bifascicular block 04/03/2019  . Cataract   . Dizziness   . Dizziness   . Hot flashes   . HTN (hypertension) 04/03/2019  . Hyperlipidemia   . Multiple nodules of lung   .  Nonrheumatic aortic (valve) insufficiency 04/03/2019  . Osteopenia   . Palpitations   . Thyroid disease   . Vitamin D deficiency     PAST SURGICAL HISTORY: Past Surgical History:  Procedure Laterality Date  . ABDOMINAL HYSTERECTOMY    . APPENDECTOMY  1975  . CATARACT EXTRACTION, BILATERAL  2010  . CHOLECYSTECTOMY  2003    FAMILY HISTORY: Family History  Problem Relation Age of Onset  . Uterine cancer  Mother        died at age 56  . Healthy Father        died at age 38  . Mental illness Sister     SOCIAL HISTORY: Social History   Socioeconomic History  . Marital status: Widowed    Spouse name: Not on file  . Number of children: 2  . 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 Highest education level: Master's degree (e.g., MA, MS, MEng, MEd, MSW, MBA)  Occupational History  . Occupation: Retired   Engineer, production  . Financial resource strain: Not on file  . Food insecurity    Worry: Not on file    Inability: Not on file  . Transportation needs    Medical: Not on file    Non-medical: Not on file  Tobacco Use  . Smoking status: Never Smoker  . Smokeless tobacco: Never Used  Substance and Sexual Activity  . Alcohol use: Never    Alcohol/week: 0.0 standard drinks    Frequency: Never  . Drug use: Never  . Sexual activity: Not on file  Lifestyle  . Physical activity    Days per week: Not on file    Minutes per session: Not on file  . Stress: Not on file  Relationships  . Social Musician on phone: Not on file    Gets together: Not on file    Attends religious service: Not on file    Active member of club or organization: Not on file    Attends meetings of clubs or organizations: Not on file    Relationship status: Not on file  . Intimate partner violence    Fear of current or ex partner: Not on file    Emotionally abused: Not on file    Physically abused: Not on file    Forced sexual activity: Not on file  Other Topics Concern  . Not on file  Social History Narrative   Lives alone.   One cup caffeine use.   Right-handed.     PHYSICAL EXAM   There were no vitals filed for this visit.  Not recorded    Blood pressure lying down 153/78 heart rate of 73, sitting up 155/70 heart rate of 72, standing up 153/71 heart rate of 73. There is no height or weight on file to calculate BMI.  PHYSICAL EXAMNIATION:  Gen: NAD, conversant, well  nourised, well groomed                     Cardiovascular: Regular rate rhythm, no peripheral edema, warm, nontender. Eyes: Conjunctivae clear without exudates or hemorrhage Neck: Supple, no carotid bruits. Pulmonary: Clear to auscultation bilaterally   NEUROLOGICAL EXAM:  MENTAL STATUS: Speech:    Speech is normal; fluent and spontaneous with normal comprehension.  Cognition:     Orientation to time, place and person     Normal recent and remote memory     Normal Attention span and concentration     Normal  Language, naming, repeating,spontaneous speech     Fund of knowledge   CRANIAL NERVES: CN II: Visual fields are full to confrontation.  Pupils are round equal and briskly reactive to light. CN III, IV, VI: extraocular movement are normal. No ptosis. CN V: Facial sensation is intact to pinprick in all 3 divisions bilaterally. Corneal responses are intact.  CN VII: Face is symmetric with normal eye closure and smile. CN VIII: Hearing is normal to causal conversation. CN IX, X: Palate elevates symmetrically. Phonation is normal. CN XI: Head turning and shoulder shrug are intact CN XII: Tongue is midline with normal movements and no atrophy.  MOTOR: Muscle bulk and tone are normal. Muscle strength is normal.  REFLEXES: Reflexes are 1 and symmetric at the biceps, triceps, knees, and ankles. Plantar responses are flexor.  SENSORY: Length dependent decreased to light touch, pinprick and vibratory sensation at ankle level  COORDINATION: Rapid alternating movements and fine finger movements are intact. There is no dysmetria on finger-to-nose and heel-knee-shin.    GAIT/STANCE: Posture is normal. Gait is steady with normal steps, base, arm swing, and turning.   DIAGNOSTIC DATA (LABS, IMAGING, TESTING) - I reviewed patient records, labs, notes, testing and imaging myself where available.   ASSESSMENT AND PLAN  Sonya Gross is a 85 y.o. female   Bilateral lower extremity  paresthesia  EMG nerve conduction study showed no evidence of large fiber peripheral neuropathy or left lumbosacral radiculopathy  There is no evidence of orthostatic blood pressure changes  Intermittent dizziness  No clear etiology identified  MRI of the brain showed age-appropriate atrophy moderate supratentorium small vessel disease  I have advised patient to keep well hydration, tight stocking, moderate exercise,  Only return to clinic for new issues,  Marcial Pacas, M.D. Ph.D.  [facility] [address], [place], [place] [address] Ph: [phone] Fax: [phone]  CC: Willey Blade, MD

## 2019-08-03 ENCOUNTER — Ambulatory Visit: Payer: Medicare Other | Admitting: Neurology

## 2019-11-15 ENCOUNTER — Encounter: Payer: Self-pay | Admitting: Cardiology

## 2019-11-15 ENCOUNTER — Other Ambulatory Visit: Payer: Self-pay

## 2019-11-15 ENCOUNTER — Ambulatory Visit: Payer: Medicare Other | Admitting: Cardiology

## 2019-11-15 VITALS — BP 128/72 | HR 93 | Temp 98.0°F | Resp 16 | Ht 64.0 in | Wt 128.0 lb

## 2019-11-15 DIAGNOSIS — I452 Bifascicular block: Secondary | ICD-10-CM

## 2019-11-15 DIAGNOSIS — I351 Nonrheumatic aortic (valve) insufficiency: Secondary | ICD-10-CM

## 2019-11-15 NOTE — Progress Notes (Signed)
Primary Physician/Referring:  Willey Blade, MD  Patient ID: Sonya Gross, female    DOB: 1929-12-24, 86 y.o.   MRN: [id number]  No chief complaint on file.  HPI:    Sonya Gross  is a 86 y.o. pleasant African-American female with mild to moderate aortic regurgitation, bifascicular block on the EKG, hypertension, mixed hyperlipidemia, presents here for follow-up of dizziness and palpitations.  Symptoms of palpitations are remained stable, she does have occasional dizziness but no syncope. Continue to remain active.    Event monitor on 04/04/2019 was unrevealing and echocardiogram on 05/12/2019 revealed normal LVEF and moderate aortic regurgitation and mitral regurgitation without pulmonary hypertension.  Past Medical History:  Diagnosis Date  . Abnormal glucose   . Bifascicular block 04/03/2019  . Cataract   . Dizziness   . Dizziness   . Hot flashes   . HTN (hypertension) 04/03/2019  . Hyperlipidemia   . Multiple nodules of lung   . Nonrheumatic aortic (valve) insufficiency 04/03/2019  . Osteopenia   . Palpitations   . Thyroid disease   . Vitamin D deficiency    Past Surgical History:  Procedure Laterality Date  . ABDOMINAL HYSTERECTOMY    . APPENDECTOMY  1975  . CATARACT EXTRACTION, BILATERAL  2010  . CHOLECYSTECTOMY  2003   Family History  Problem Relation Age of Onset  . Uterine cancer Mother        died at age 18  . Healthy Father        died at age 2  . Mental illness Sister     Social History   Tobacco Use  . Smoking status: Never Smoker  . Smokeless tobacco: Never Used  Substance Use Topics  . Alcohol use: Never    Alcohol/week: 0.0 standard drinks   ROS  Review of Systems  Cardiovascular: Positive for palpitations (occasional). Negative for dyspnea on exertion, leg swelling and syncope.  Endocrine: Negative for cold intolerance.  Hematologic/Lymphatic: Does not bruise/bleed easily.  Musculoskeletal: Negative for joint swelling.   Gastrointestinal: Negative for abdominal pain, anorexia, change in bowel habit, hematochezia and melena.  Neurological: Positive for dizziness. Negative for headaches.  Psychiatric/Behavioral: Negative for depression and substance abuse.  All other systems reviewed and are negative.  Objective  There were no vitals taken for this visit.  Vitals with BMI 06/20/2019 05/15/2019 04/14/2019  Height 5' 4" 5' 4" -  Weight 121 lbs 119 lbs -  BMI 11.91 47.82 -  Systolic 956 213 086  Diastolic 76 73 70  Pulse 69 77 81     Physical Exam  Constitutional: She appears well-developed and well-nourished. No distress.  HENT:  Head: Atraumatic.  Eyes: Conjunctivae are normal.  Neck: No JVD present. No thyromegaly present.  Cardiovascular: Normal rate, regular rhythm, S1 normal, S2 normal, intact distal pulses and normal pulses. Exam reveals no gallop.  Murmur heard. High-pitched decrescendo early diastolic murmur is present with a grade of 1/6 at the upper right sternal border radiating to the apex. No JVD.  There is no leg edema.  Pulmonary/Chest: Effort normal and breath sounds normal.  Abdominal: Soft. Bowel sounds are normal.  Musculoskeletal:        General: No edema. Normal range of motion.     Cervical back: Neck supple.  Neurological: She is alert.  Skin: Skin is warm and dry.  Psychiatric: She has a normal mood and affect.   Laboratory examination:   Recent Labs    12/25/18 1845 12/27/18 2306 02/04/19 5784  NA 139 139 138  K 3.8 3.8 3.8  CL 102 105 102  CO2 _0 GLUCOSE 115* 135* 96  BUN _1 CREATININE 0.72 0.97 0.75  CALCIUM 9.5 9.8 10.2  GFRNONAA >60 53* >60  GFRAA >60 >60 >60   CrCl cannot be calculated (Patient's most recent lab result is older than the maximum 21 days allowed.).  CMP Latest Ref Rng & Units 02/04/2019 12/27/2018 12/25/2018  Glucose 70 - 99 mg/dL 96 135(H) 115(H)  BUN 8 - 23 mg/dL _2 Creatinine 0.44 - 1.00 mg/dL 0.75 0.97 0.72  Sodium  135 - 145 mmol/L 138 139 139  Potassium 3.5 - 5.1 mmol/L 3.8 3.8 3.8  Chloride 98 - 111 mmol/L 102 105 102  CO2 22 - 32 mmol/L _3 Calcium 8.9 - 10.3 mg/dL 10.2 9.8 9.5  Total Protein 6.5 - 8.1 g/dL 7.2 - 7.9  Total Bilirubin 0.3 - 1.2 mg/dL 1.5(H) - 1.1  Alkaline Phos 38 - 126 U/L 45 - 49  AST 15 - 41 U/L 24 - 23  ALT 0 - 44 U/L 19 - 22   CBC Latest Ref Rng & Units 02/04/2019 12/27/2018 12/25/2018  WBC 4.0 - 10.5 K/uL 5.0 5.2 4.7  Hemoglobin 12.0 - 15.0 g/dL 15.0 14.7 15.9(H)  Hematocrit 36.0 - 46.0 % 45.9 45.8 48.0(H)  Platelets 150 - 400 K/uL 173 176 173   Recent Labs    02/04/19 0909  TSH 5.166*   External labs:  Labs 03/07/2019:  Serum glucose 95 mg, BUN 13, creatinine 0.69, EGFR greater than 60 mL, CMP otherwise normal.  A1c 6.0%.  Sedimentation rate 10 mm/h. Total cholesterol 206, triglycerides 79, HDL 56, LDL 134.  LDL particle #1545. TSH 4.660, T4 normal at 1.10.  T3 2.7, normal. Medications and allergies   Allergies  Allergen Reactions  . Penicillins Swelling and Rash    Did it involve swelling of the face/tongue/throat, SOB, or low BP? No Did it involve sudden or severe rash/hives, skin peeling, or any reaction on the inside of your mouth or nose? Yes Did you need to seek medical attention at a hospital or doctor's office? No When did it last happen?Over 10 years ago If all above answers are "NO", may proceed with cephalosporin use.     Current Outpatient Medications  Medication Instructions  . albuterol (VENTOLIN HFA) 108 (90 Base) MCG/ACT inhaler 2 puffs, Inhalation, Every 4 hours PRN  . bimatoprost (LUMIGAN) 0.01 % SOLN 1 drop, Right Eye, Daily at bedtime  . budesonide-formoterol (SYMBICORT) 80-4.5 MCG/ACT inhaler 2 puffs, Inhalation, 2 times daily  . Cholecalciferol (VITAMIN D3 PO) 1 tablet, Oral, Daily  . clobetasol cream (TEMOVATE) 2.11 % 1 application, Topical, As needed  . levothyroxine (SYNTHROID) 50 mcg, Oral, Daily before breakfast  .  montelukast (SINGULAIR) 10 mg, Oral, As needed  . Multiple Vitamin (MULTIVITAMIN) tablet 1 tablet, Oral, Daily  . rosuvastatin (CRESTOR) 20 mg, Oral, Daily    Radiology:   No results found.  Cardiac Studies:     Treadmill stress test [07/29/2015]: Indication:RBBB, Abnormal ECG The patient exercised according to Bruce Protocol, Total time recorded 3:01 min achieving max heart rate of 132 which was 96% of MPHR for age and 4.64 METS of work. Normal BP response. Resting ECG showing a NSR with a RBBB. There was no ST-T changes of ischemia with exercise stress test. Stress terminated due to fatigue and THR >85% MPHR met. Rare PVC. Markedly reduced  exercise capacity and aerobic threshold.  Event monitor 04/04/2019 - 04/17/2019.  Baseline sample showed Sinus Rhythm w/Artifact with a heart rate of 84.2 bpm. There were 0 critical, 0 serious, and 39 stable events that occurred.  Symptomatic transmissions of dyspnea and lightheadedness revealed sinus rhythm/sinus tachycardia.  No other significant arrhythmias noted.  Echocardiogram 05/12/2019: Left ventricle cavity is normal in size. Normal left ventricular wall thickness. Normal LV systolic function with EF 61%. Normal global wall motion. Doppler evidence of grade I (impaired) diastolic dysfunction, normal LAP.  Trileaflet aortic valve. Mild aortic valve leaflet calcification. Moderate (Grade II) aortic regurgitation. Moderate (Grade III) mitral regurgitation. Mild tricuspid regurgitation.  No evidence of pulmonary hypertension. Compared to previous study in 2017, mitral and tricuspid regurgitation increased in severity.    EKG 05/15/2039: Normal sinus rhythm at the rate of 75 bpm, left atrial enlargement, left axis deviation, left anterior fascicular block.  Right bundle branch block. No significant change from  EKG 10/07/2018, 10/18/2016:  Assessment     ICD-10-CM   1. Essential hypertension  I10   2. Bifascicular block  I45.2     No orders of  the defined types were placed in this encounter.   There are no discontinued medications.   Recommendations:   DENAI CABA  is a 86 y.o. pleasant African-American female with mild to moderate aortic regurgitation, bifascicular block on the EKG, hypertension, mixed hyperlipidemia, presents here for follow-up of dizziness and palpitations.    Symptoms of palpitations are remained stable, she does have occasional dizziness but no syncope, mostly related to position, otherwise she essentially remains asymptomatic, continues to remain independent and active. She was previously hypertensive, but not on any medication. BP is normal. I will see her in a year for F/U of bifascicular block and moderate AI. No CHF on exam. No change in exam.    Adrian Prows, MD, [facility] 11/15/2019, [facility]. [place] Office: [phone]

## 2020-05-14 IMAGING — CR CHEST - 2 VIEW
2 series · 2 of 2 positions shown · non-contrast
Comparison: 12/25/2018

CLINICAL DATA: Generalized weakness for 1 week

EXAM:
CHEST - 2 VIEW

[chest pa]
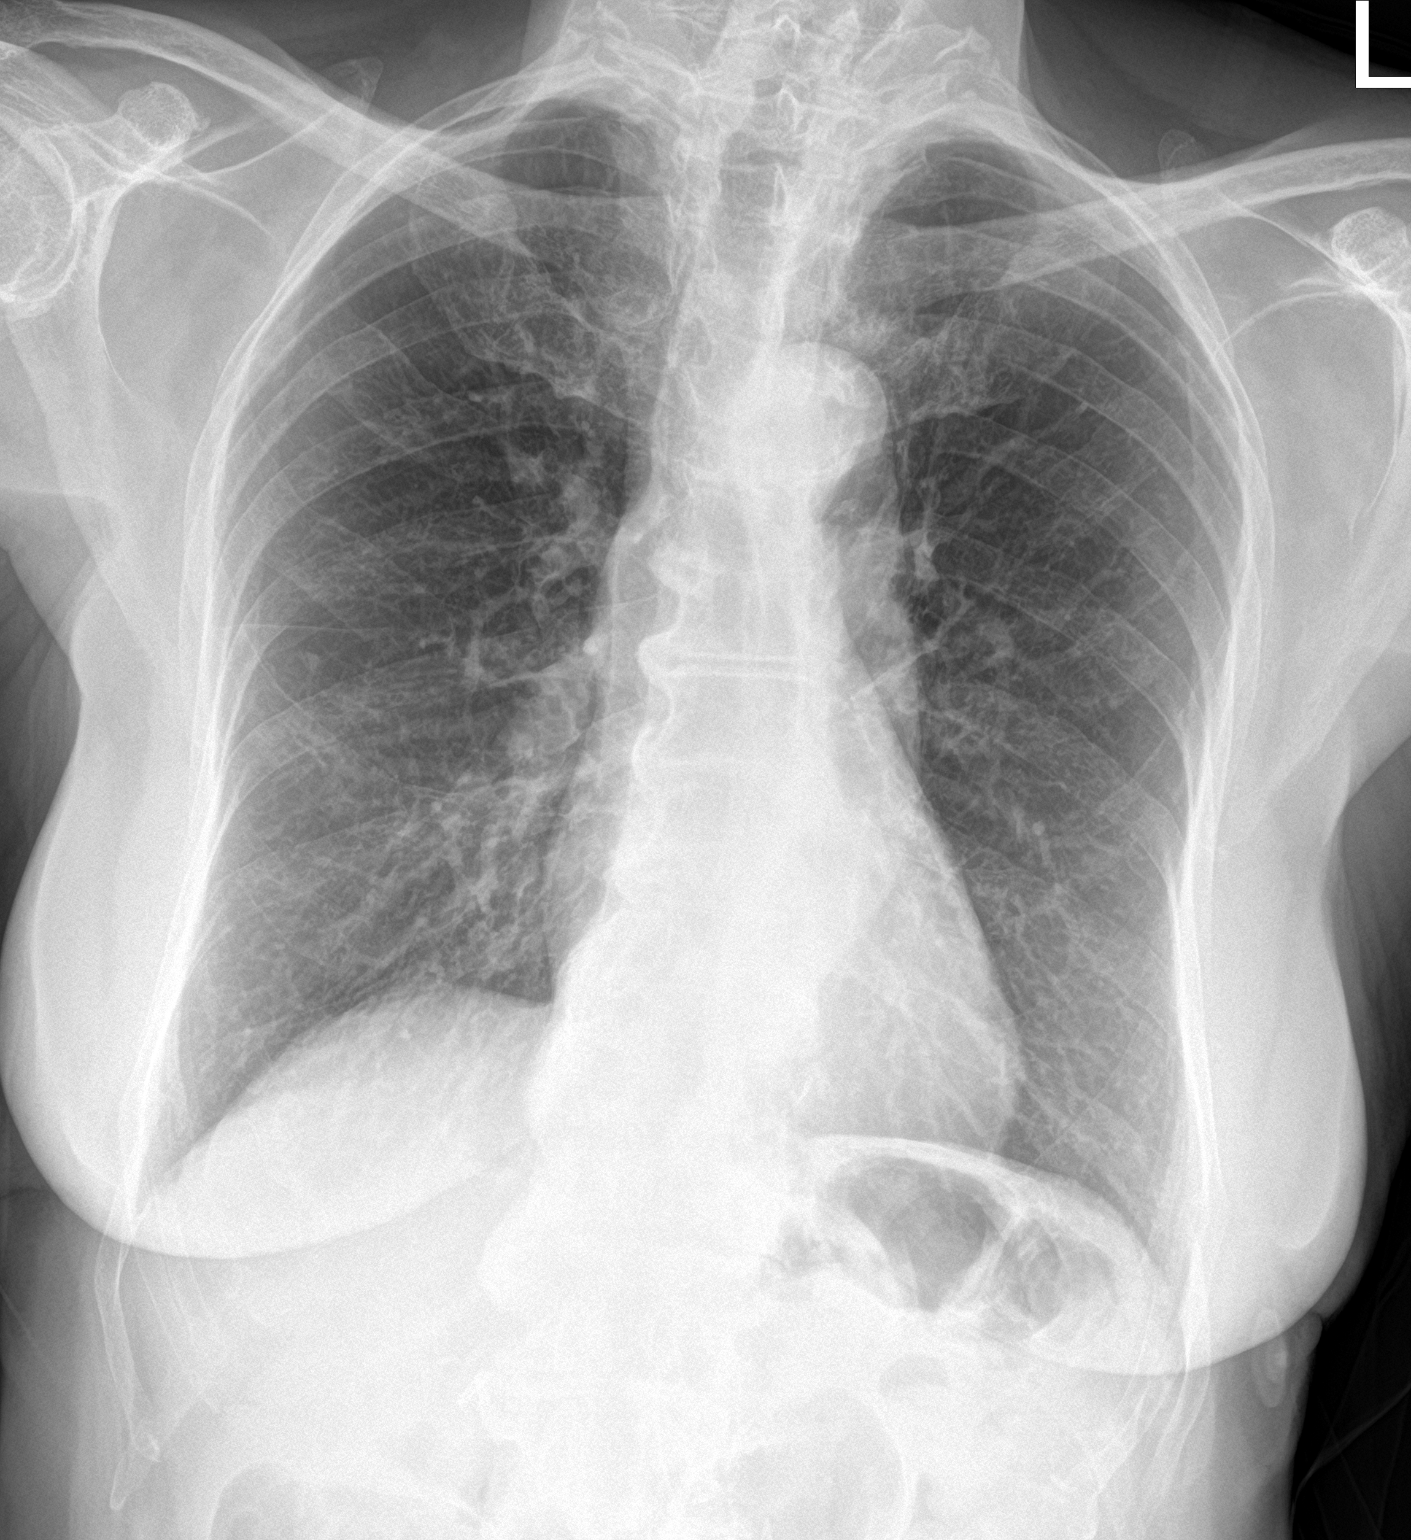

[chest lat]
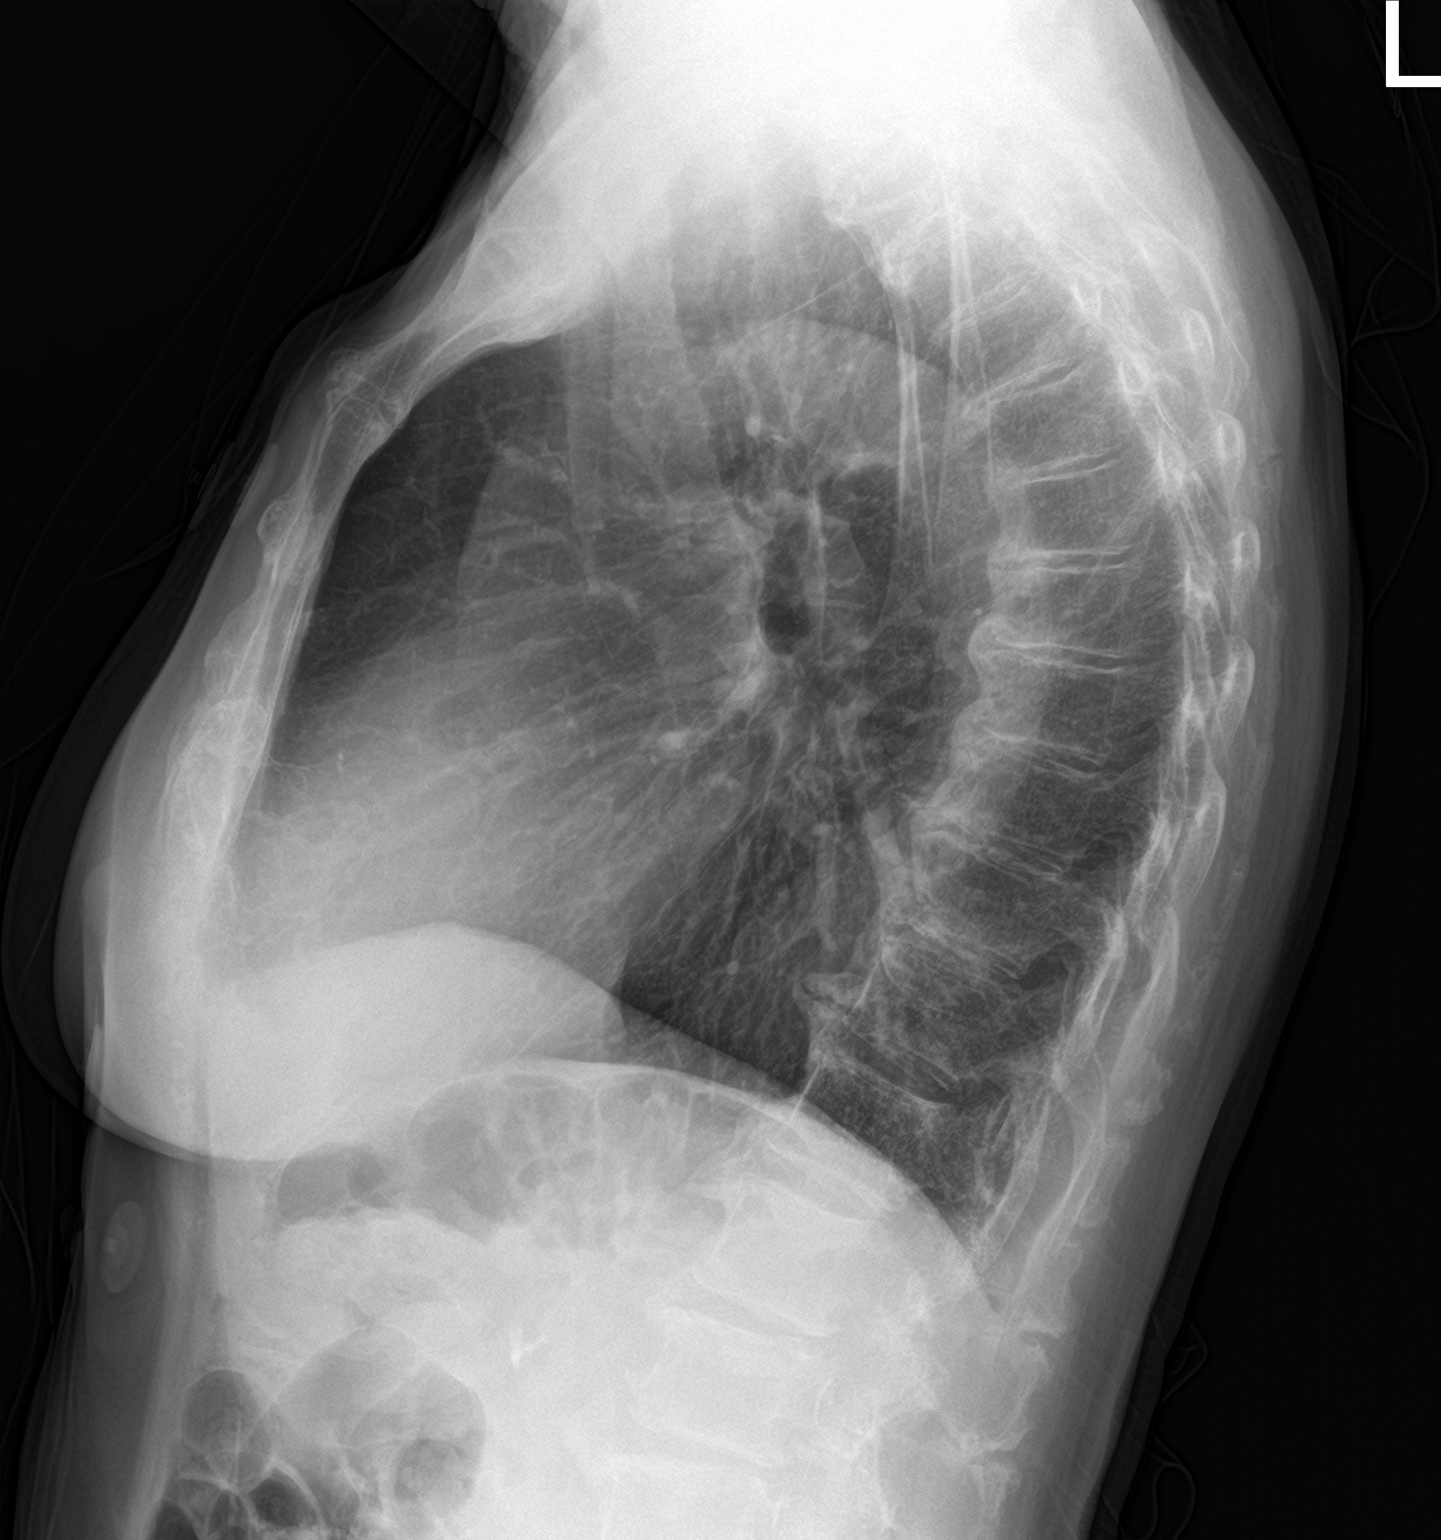

[2 of 2 positions shown; findings below may reference images not displayed]

FINDINGS: Cardiac shadows within normal limits. Aortic calcifications are
again seen. Lungs are well aerated bilaterally. No focal infiltrate
or sizable effusion is seen. No acute bony abnormality is noted.
Degenerative change of thoracic spine is seen.
IMPRESSION: No acute abnormality noted.

## 2020-06-17 ENCOUNTER — Ambulatory Visit: Payer: Medicare PPO | Attending: Internal Medicine

## 2020-06-17 DIAGNOSIS — Z23 Encounter for immunization: Secondary | ICD-10-CM

## 2020-06-17 NOTE — Progress Notes (Signed)
   Covid-19 Vaccination Clinic  Name:  Sonya Gross    MRN: [id number] DOB: 06-Feb-1930  06/17/2020  Sonya Gross was observed post Covid-19 immunization for 15 minutes without incident. She was provided with Vaccine Information Sheet and instruction to access the V-Safe system.   Sonya Gross was instructed to call [phone] with any severe reactions post vaccine: Marland Kitchen Difficulty breathing  . Swelling of face and throat  . A fast heartbeat  . A bad rash all over body  . Dizziness and weakness

## 2020-08-26 IMAGING — US US THYROID
1 series · 16 of 25 positions shown · non-contrast
Comparison: None.

CLINICAL DATA: 88-year-old female with a history of hypothyroidism

EXAM:
THYROID ULTRASOUND
TECHNIQUE: Ultrasound examination of the thyroid gland and adjacent soft
tissues was performed.

[Series 1: us thyroid · 0.07mm/px · 16 of 54 slices shown]
[im 1/54]
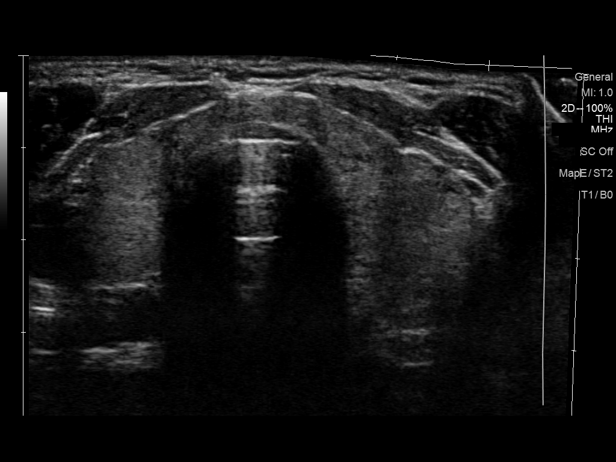
[im 5/54]
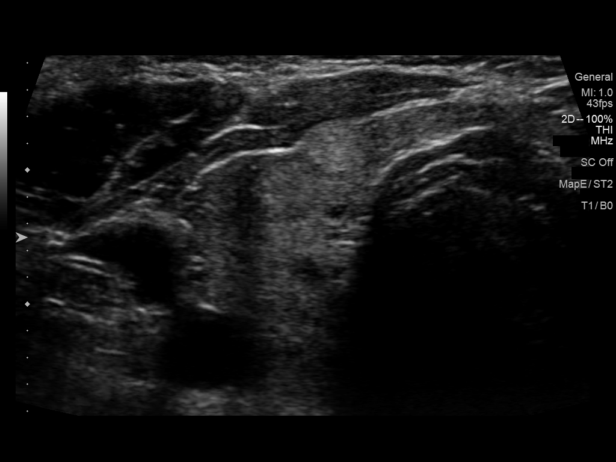
[im 7/54]
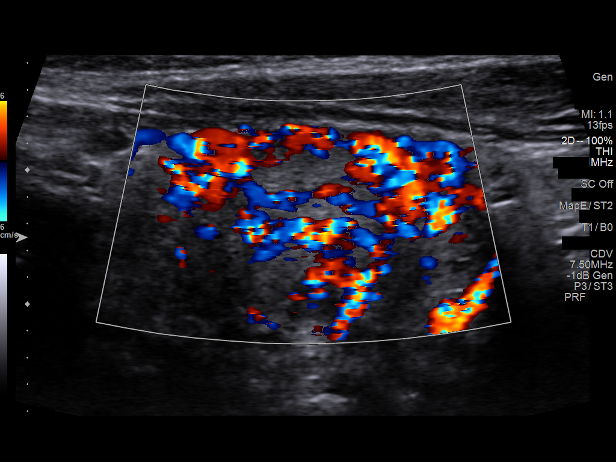
[im 12/54]
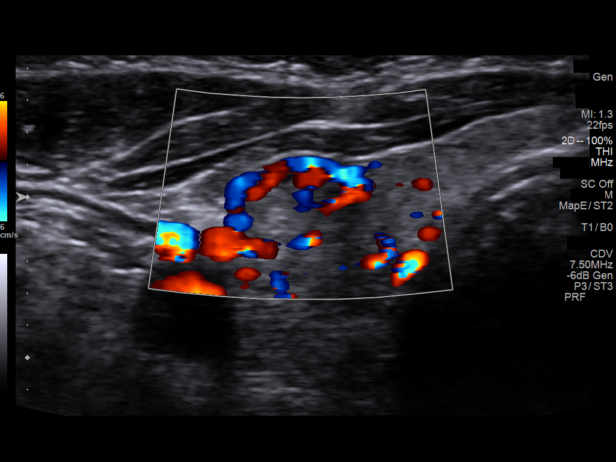
[im 16/54]
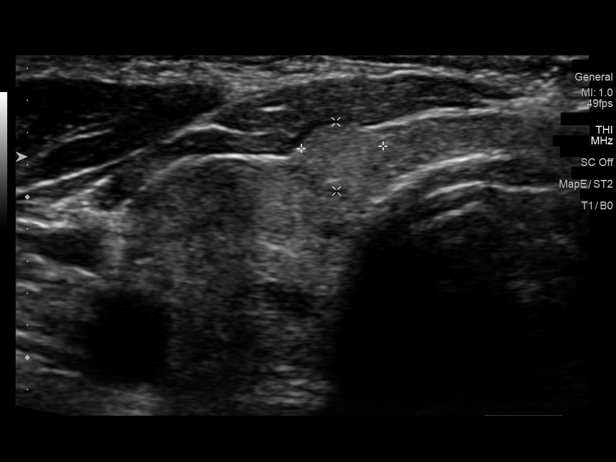
[im 18/54]
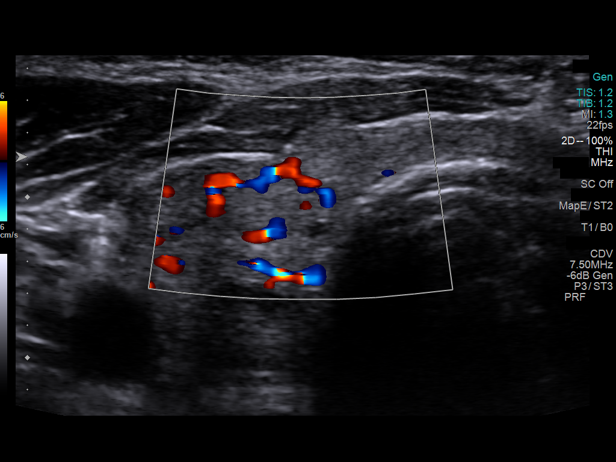
[im 23/54]
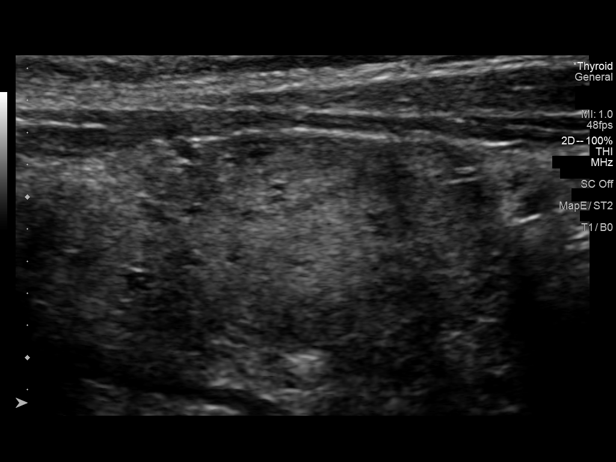
[im 25/54]
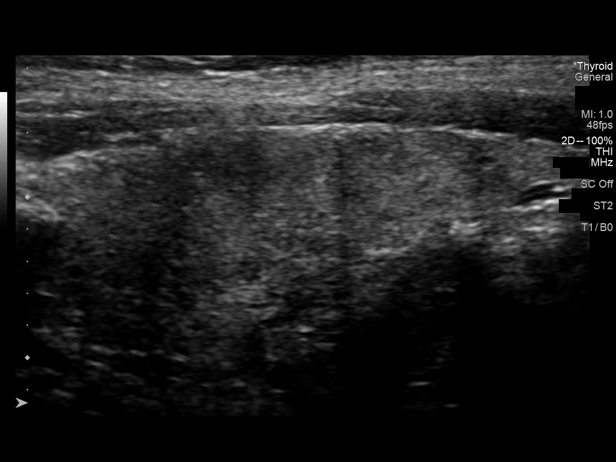
[im 29/54]
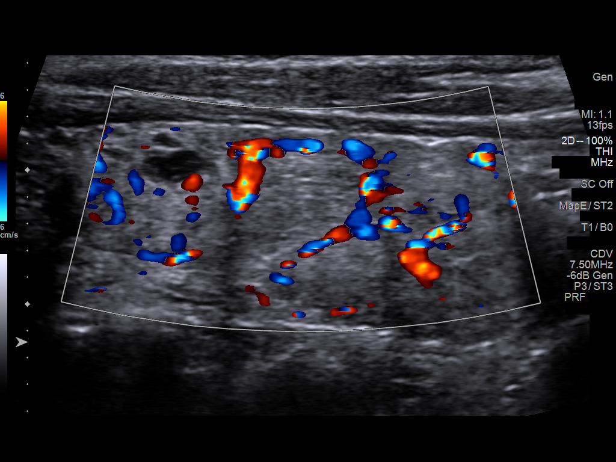
[im 31/54]
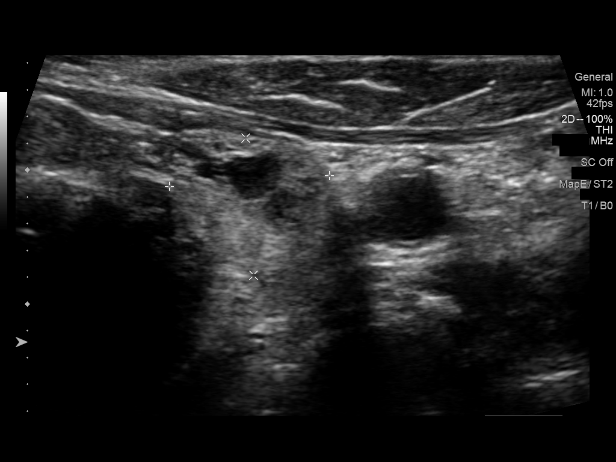
[im 36/54]
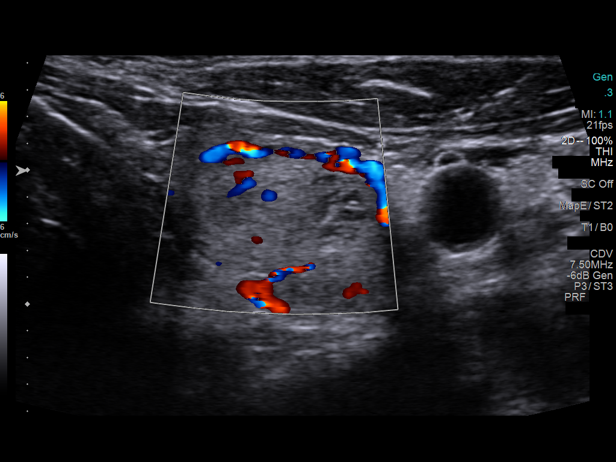
[im 38/54]
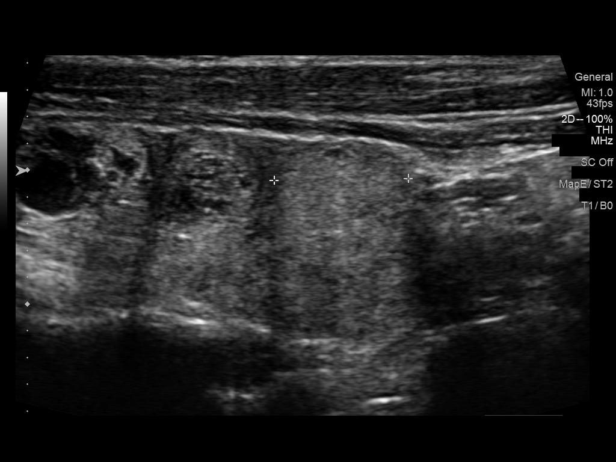
[im 42/54]
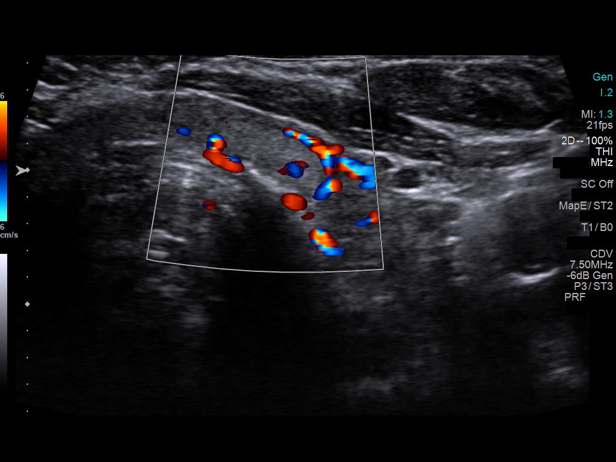
[im 47/54]
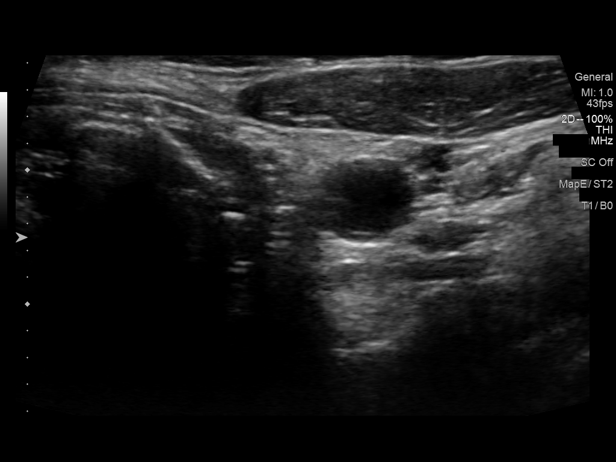
[im 49/54]
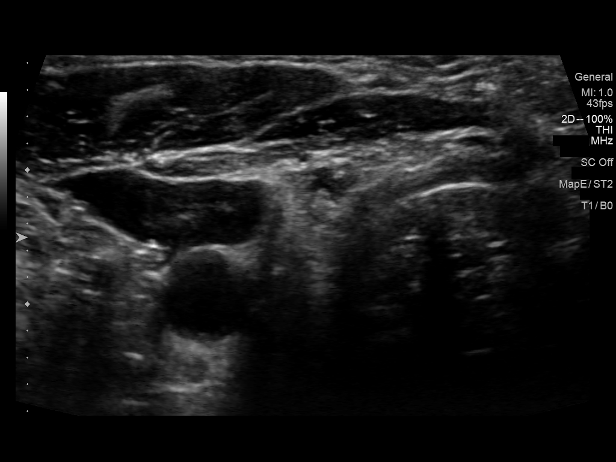
[im 54/54]
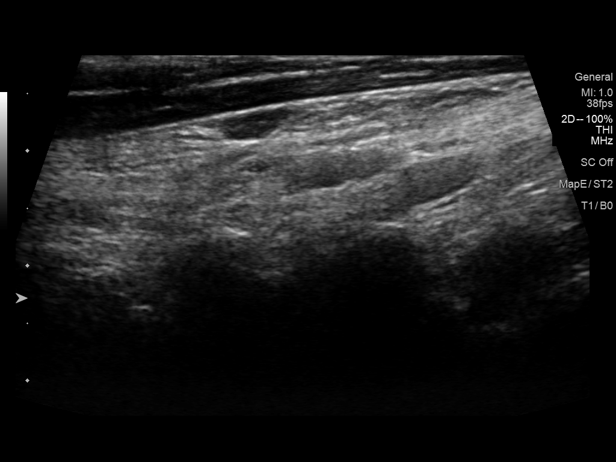

[16 of 25 positions shown; findings below may reference images not displayed]

FINDINGS: Parenchymal Echotexture: Mildly heterogenous

Isthmus: 4.0 cm

Right lobe: 3.5 cm x 1.6 cm x 1.4 cm

Left lobe: 3.8 cm x 1.5 cm x 1.8 cm

_________________________________________________________

Estimated total number of nodules >/= 1 cm: 4

Number of spongiform nodules >/=  2 cm not described below (TR1): 0

Number of mixed cystic and solid nodules >/= 1.5 cm not described
below (TR2): 0

_________________________________________________________

Nodule # 1:

Location: Right; Mid

Maximum size: 0.9 cm; Other 2 dimensions: 0.8 cm x 0.5 cm

Composition: solid/almost completely solid (2)

Echogenicity: isoechoic (1)

Shape: not taller-than-wide (0)

Margins: ill-defined (0)

Echogenic foci: none (0)

ACR TI-RADS total points: 3.

ACR TI-RADS risk category: TR3 (3 points).

ACR TI-RADS recommendations:

Nodule does not meet criteria for surveillance or biopsy

_________________________________________________________

Nodule # 2:

Location: Right; Mid

Maximum size: 0.9 cm; Other 2 dimensions: 0.8 cm x 0.5 cm

Composition: cannot determine (2)

Echogenicity: isoechoic (1)

Shape: not taller-than-wide (0)

Margins: ill-defined (0)

Echogenic foci: none (0)

ACR TI-RADS total points: 3.

ACR TI-RADS risk category: TR3 (3 points).

ACR TI-RADS recommendations:

Nodule does not meet criteria for surveillance or biopsy

_________________________________________________________

Nodule # 3:

Location: Right; Mid

Maximum size: 0.5 cm; Other 2 dimensions: 0.5 cm x 0.4 cm

Composition: solid/almost completely solid (2)

Echogenicity: hyperechoic (1)

Shape: not taller-than-wide (0)

Margins: ill-defined (0)

Echogenic foci: none (0)

ACR TI-RADS total points: 3.

ACR TI-RADS risk category: TR3 (3 points).

ACR TI-RADS recommendations:

Nodule does not meet criteria for surveillance or biopsy

_________________________________________________________

Nodule # 4:

Location: Right; Inferior

Maximum size: 0.8 cm; Other 2 dimensions: 0.8 cm x 0.6 cm

Composition: cannot determine (2)

Echogenicity: hypoechoic (2)

Shape: taller-than-wide (3)

Margins: ill-defined (0)

Echogenic foci: none (0)

ACR TI-RADS total points: 4.

ACR TI-RADS risk category: TR4 (4-6 points).

ACR TI-RADS recommendations:

Nodule does not meet criteria for surveillance or biopsy

_________________________________________________________

Nodule # 5:

Location: Left; Superior

Maximum size: 1.4 cm; Other 2 dimensions: 1.2 cm x 1.0 cm

Composition: cannot determine (2)

Echogenicity: hypoechoic (2)

Shape: not taller-than-wide (0)

Margins: ill-defined (0)

Echogenic foci: none (0)

ACR TI-RADS total points: 4.

ACR TI-RADS risk category: TR4 (4-6 points).

ACR TI-RADS recommendations:

Nodule meets criteria for surveillance

_________________________________________________________

Nodule # 6:

Location: Left; Mid

Maximum size: 1.5 cm; Other 2 dimensions: 1.3 cm x 1.3 cm

Composition: solid/almost completely solid (2)

Echogenicity: isoechoic (1)

Shape: not taller-than-wide (0)

Margins: ill-defined (0)

Echogenic foci: none (0)

ACR TI-RADS total points: 3.

ACR TI-RADS risk category: TR3 (3 points).

ACR TI-RADS recommendations:

Nodule meets criteria for surveillance

_________________________________________________________

Nodule # 7:

Location: Left; Mid

Maximum size: 1.0 cm; Other 2 dimensions: 1.0 cm x 0.7 cm

Composition: solid/almost completely solid (2)

Echogenicity: isoechoic (1)

Shape: not taller-than-wide (0)

Margins: ill-defined (0)

Echogenic foci: none (0)

ACR TI-RADS total points: 3.

ACR TI-RADS risk category: TR3 (3 points).

ACR TI-RADS recommendations:

Nodule does not meet criteria for surveillance or biopsy

_________________________________________________________

Nodule # 8:

Location: Left; Inferior

Maximum size: 1.1 cm; Other 2 dimensions: 1.0 cm x 0.5 cm

Composition: solid/almost completely solid (2)

Echogenicity: isoechoic (1)

Shape: not taller-than-wide (0)

Margins: ill-defined (0)

Echogenic foci: none (0)

ACR TI-RADS total points: 3.

ACR TI-RADS risk category: TR3 (3 points).

ACR TI-RADS recommendations:

Nodule does not meet criteria for surveillance or biopsy

_________________________________________________________

No adenopathy
IMPRESSION: Multinodular thyroid.

Left superior thyroid nodule (labeled 5) and left mid thyroid nodule
(labeled 6) both meet criteria for surveillance, as designated by
the newly established ACR TI-RADS criteria. Surveillance ultrasound
study recommended to be performed annually up to 5 years.

No other thyroid nodule meets criteria for biopsy or surveillance,
as designated by the newly established ACR TI-RADS criteria.

Recommendations follow those established by the new ACR TI-RADS
criteria ([HOSPITAL] 3848;[DATE]).

## 2020-09-02 IMAGING — CT CT HEAD WITHOUT AND WITH CONTRAST
4 of 5 series · 16 of 47 positions shown, 18 images · IV contrast (iopamidol)
Comparison: CT head 05/14/2009

CLINICAL DATA: Non intractable headache

Creatinine was obtained on site at [HOSPITAL] at [REDACTED].
Results: Creatinine 0.7 mg/dL.
EXAM:
CT HEAD WITHOUT AND WITH CONTRAST
TECHNIQUE: Contiguous axial images were obtained from the base of the skull
through the vertex without and with intravenous contrast
CONTRAST:  75mL 1PX0T3-ZPP IOPAMIDOL (1PX0T3-ZPP) INJECTION 61%

[Series 2: brain 5.00 hr40 s3 ibhc · axial · 0.42mm/px · z∈[-486,-361]mm · 8 of 33 slices shown, 10 images]
[im 4/33  brain]
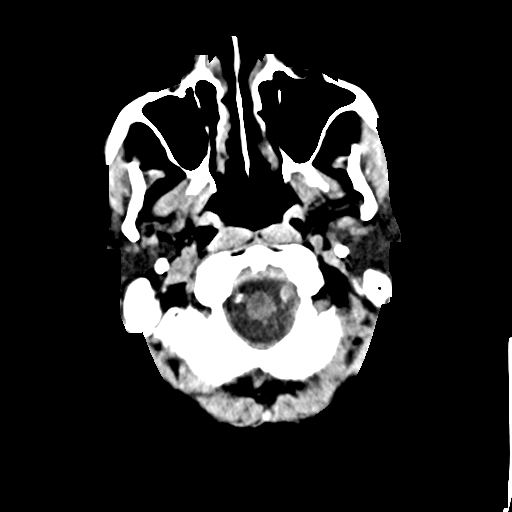
[im 4/33  bone]
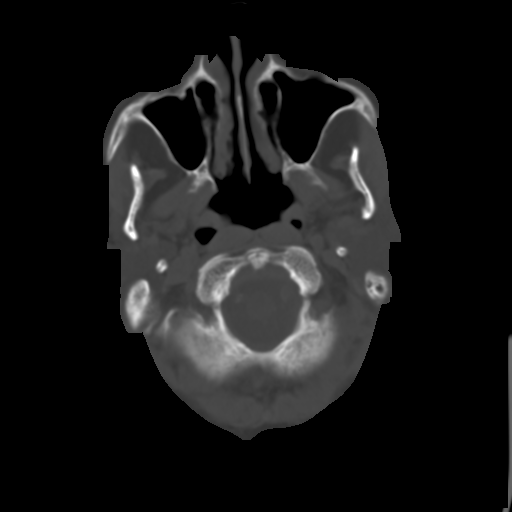
[im 8/33  brain]
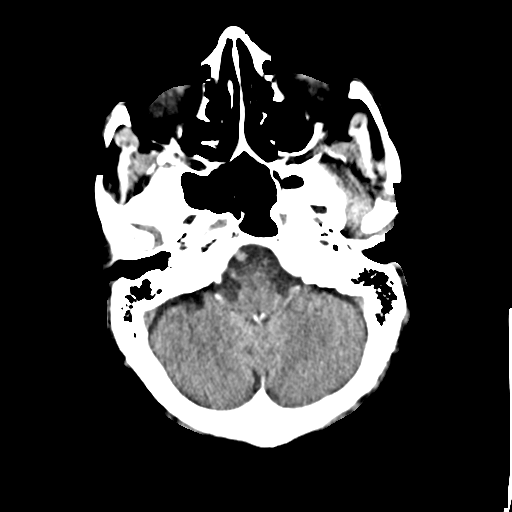
[im 11/33  brain]
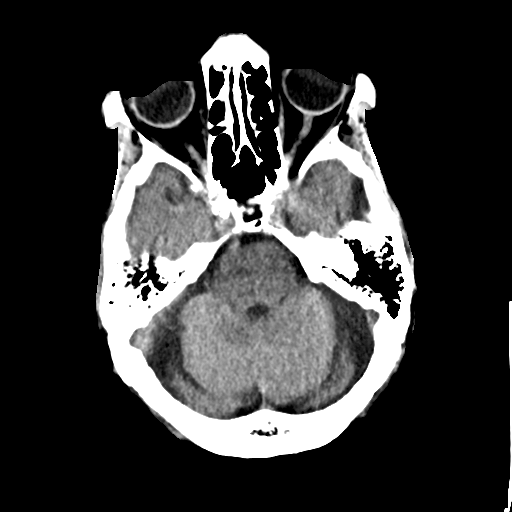
[im 15/33  brain]
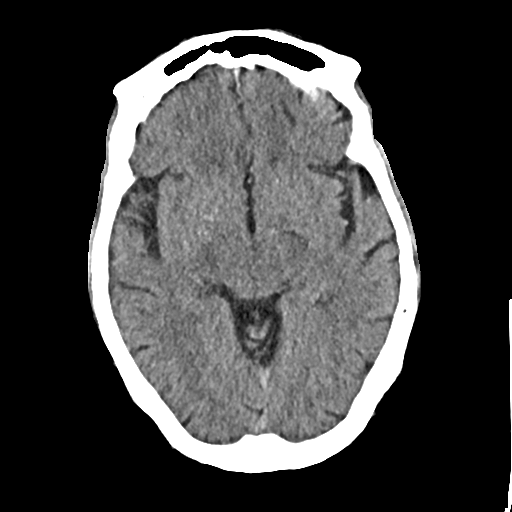
[im 18/33  brain]
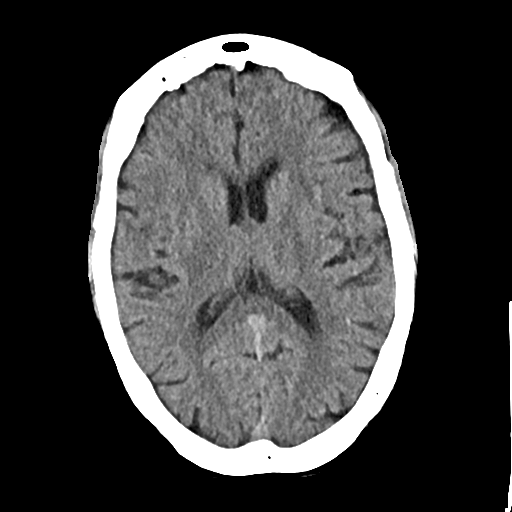
[im 18/33  bone]
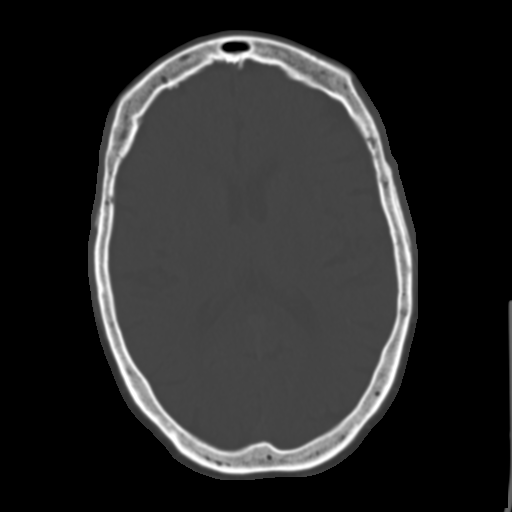
[im 22/33  brain]
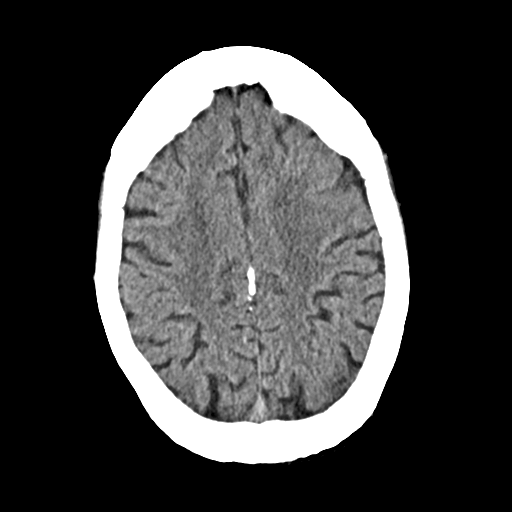
[im 25/33  brain]
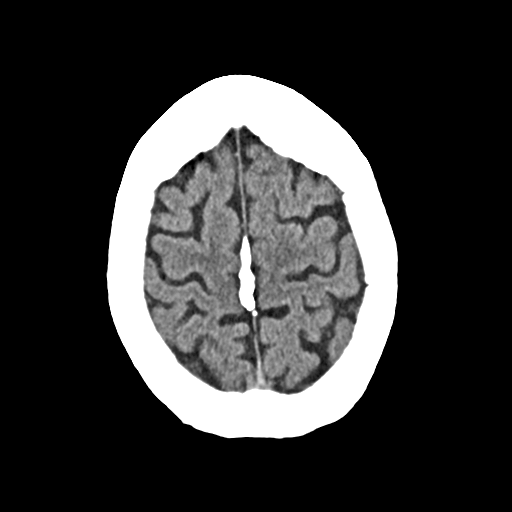
[im 29/33  brain]
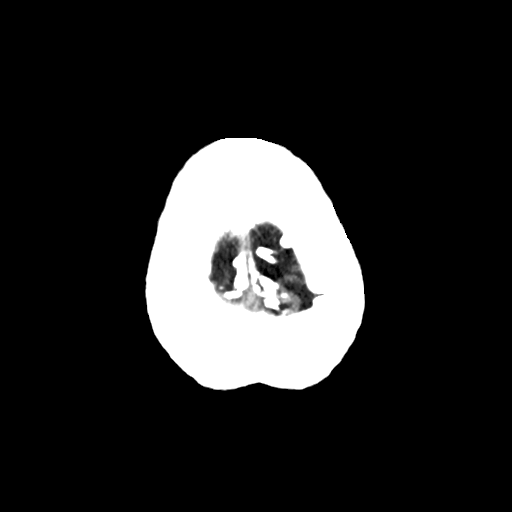

[Series 3: brain 5.00 hr60 s3 axial · axial · 0.42mm/px · z∈[-486,-466]mm · 2 of 33 slices shown]
[im 4/33  brain]
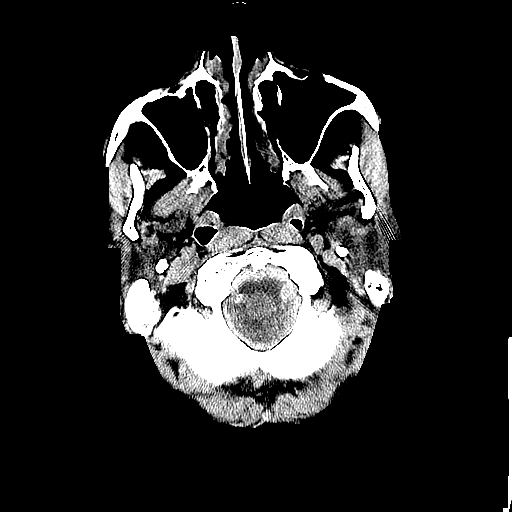
[im 8/33  brain]
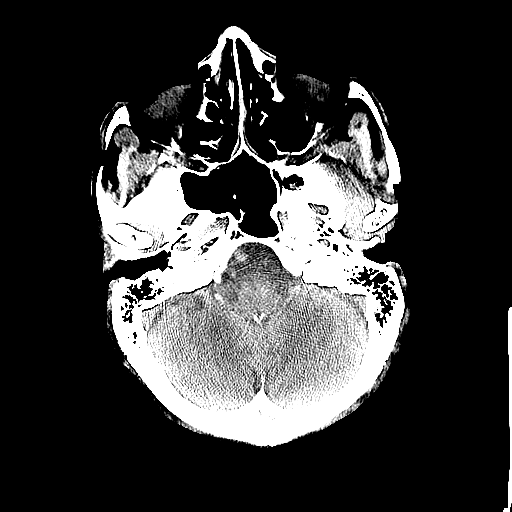

[Series 5: brain 3.00 hr40 s3 cor ibhc · coronal · 0.33mm/px · 3 of 70 slices shown]
[im 24/70  brain]
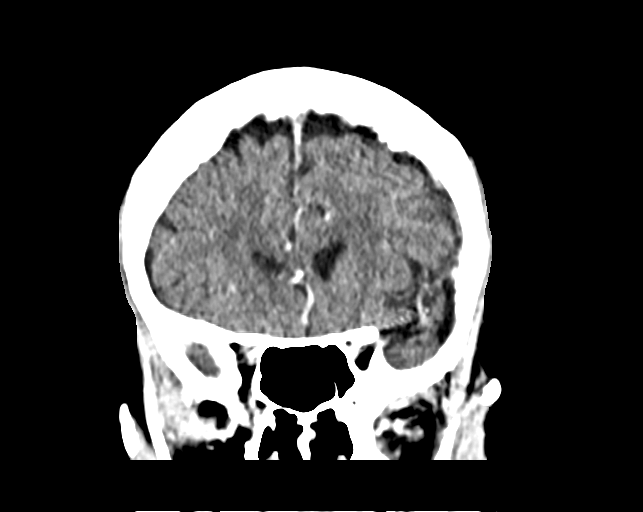
[im 31/70  brain]
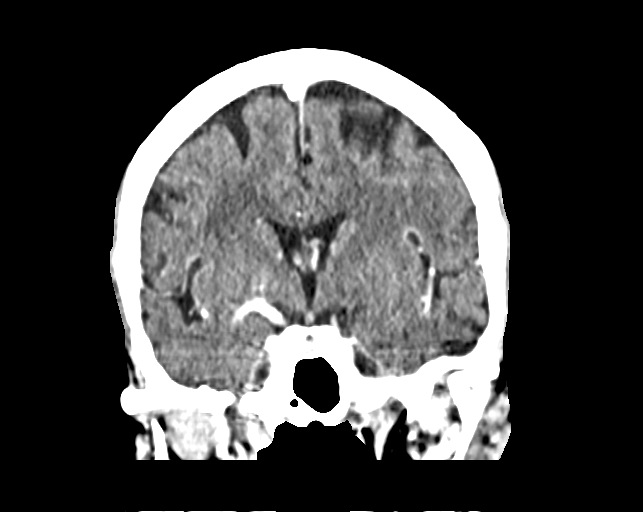
[im 39/70  brain]
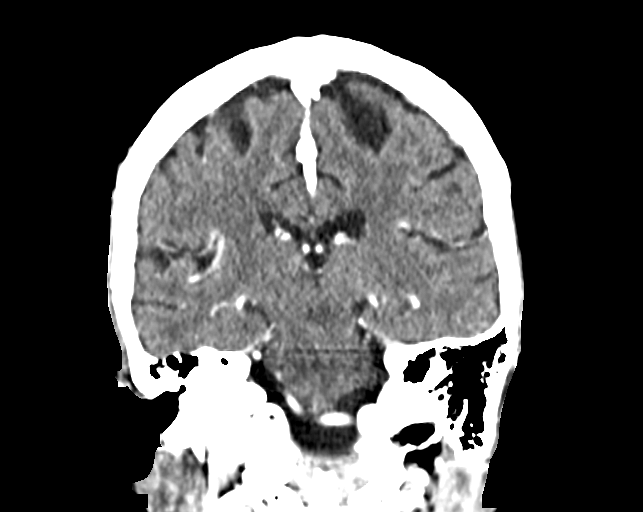

[Series 7: brain 3.00 hr40 s3 sag ibhc · sagittal · 0.33mm/px · 3 of 58 slices shown]
[im 20/58  brain]
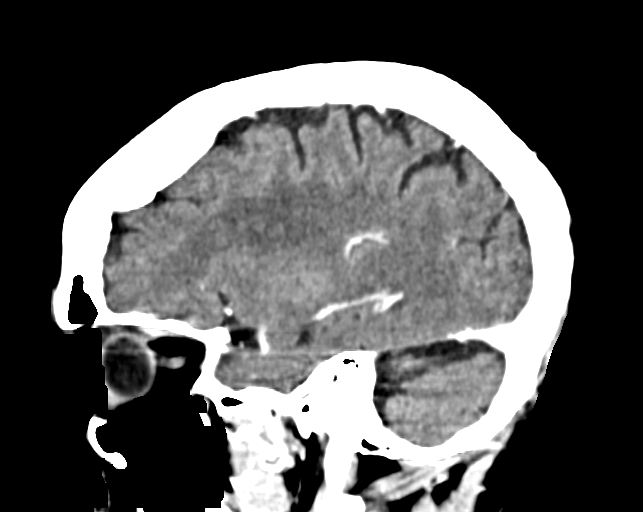
[im 29/58  brain]
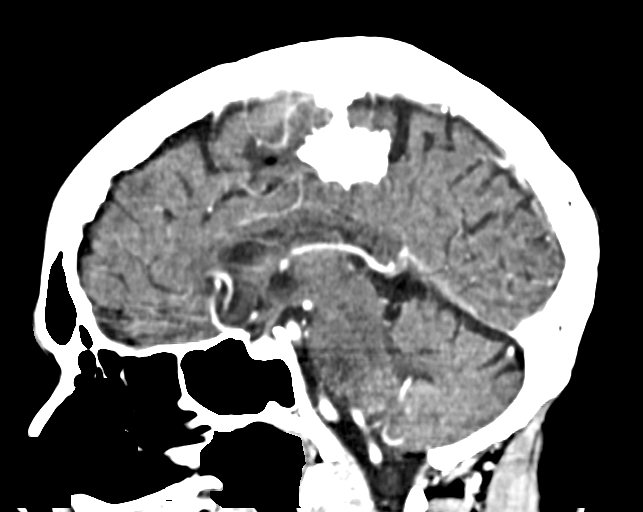
[im 39/58  brain]
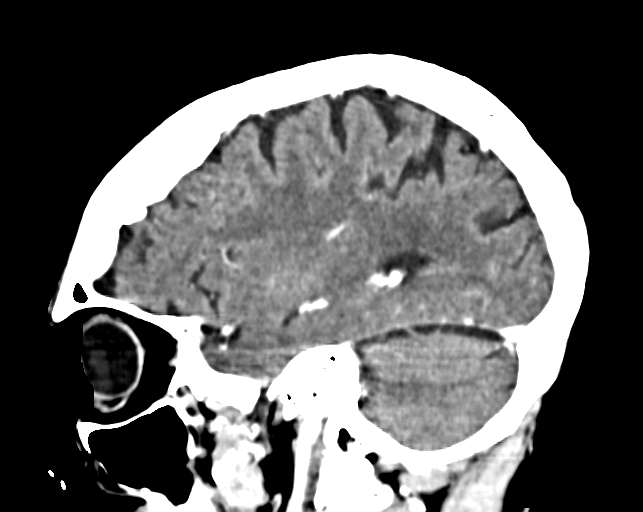

[16 of 47 positions shown; findings below may reference images not displayed]

FINDINGS: Brain: Ventricle size normal. Cerebral volume normal for age. Mild
chronic white matter changes noted previously. Negative for acute
infarct, hemorrhage, or mass.

Normal enhancement postcontrast administration.

Vascular: Negative for hyperdense vessel. Normal arterial and venous
enhancement.

Skull: Negative

Sinuses/Orbits: Negative

Other: None
IMPRESSION: No acute abnormality. Mild chronic white matter ischemia as noted in
1303 without significant progression.

## 2020-11-14 ENCOUNTER — Ambulatory Visit: Payer: Medicare PPO | Admitting: Cardiology

## 2020-11-14 ENCOUNTER — Encounter: Payer: Self-pay | Admitting: Cardiology

## 2020-11-14 ENCOUNTER — Other Ambulatory Visit: Payer: Self-pay

## 2020-11-14 VITALS — BP 161/81 | HR 85 | Temp 98.3°F | Resp 17 | Ht 64.0 in | Wt 135.0 lb

## 2020-11-14 DIAGNOSIS — R42 Dizziness and giddiness: Secondary | ICD-10-CM

## 2020-11-14 DIAGNOSIS — I10 Essential (primary) hypertension: Secondary | ICD-10-CM

## 2020-11-14 DIAGNOSIS — I452 Bifascicular block: Secondary | ICD-10-CM | POA: Diagnosis not present

## 2020-11-14 DIAGNOSIS — I351 Nonrheumatic aortic (valve) insufficiency: Secondary | ICD-10-CM | POA: Diagnosis not present

## 2020-11-14 MED ORDER — HYDRALAZINE HCL 25 MG PO TABS: 25.0000 mg | ORAL_TABLET | Freq: Three times a day (TID) | ORAL | 2 refills | Status: DC

## 2020-11-14 NOTE — Progress Notes (Signed)
Primary Physician/Referring:  Caren Macadam, MD  Patient ID: Sonya Gross, female    DOB: [birth year], 87 y.o.   MRN: [id number]  Chief Complaint  Patient presents with  . Follow-up    1 YEAR  . bifascicular  . aortic regurgitation  . Hypertension   HPI:    Sonya Gross  is a 87 y.o. pleasant African-American female with mild to moderate aortic regurgitation, bifascicular block on the EKG, hypertension, mixed hyperlipidemia, presents here for follow-up of dizziness and palpitations.  Symptoms of palpitations are remained stable, chronic dizziness unrelated to her position presents here for follow-up on annual basis.  She continues to remain active.    Event monitor on 04/04/2019 was unrevealing and echocardiogram on 05/12/2019 revealed normal LVEF and moderate aortic regurgitation and mitral regurgitation without pulmonary hypertension.  Past Medical History:  Diagnosis Date  . Abnormal glucose   . Bifascicular block 04/03/2019  . Cataract   . Dizziness   . Dizziness   . Hot flashes   . HTN (hypertension) 04/03/2019  . Hyperlipidemia   . Multiple nodules of lung   . Nonrheumatic aortic (valve) insufficiency 04/03/2019  . Osteopenia   . Palpitations   . Thyroid disease   . Vitamin D deficiency    Past Surgical History:  Procedure Laterality Date  . ABDOMINAL HYSTERECTOMY    . APPENDECTOMY  1975  . CATARACT EXTRACTION, BILATERAL  2010  . CHOLECYSTECTOMY  2003   Family History  Problem Relation Age of Onset  . Uterine cancer Mother        died at age 84  . Healthy Father        died at age 64  . Mental illness Sister     Social History   Tobacco Use  . Smoking status: Never Smoker  . Smokeless tobacco: Never Used  Substance Use Topics  . Alcohol use: Never    Alcohol/week: 0.0 standard drinks   ROS  Review of Systems  Cardiovascular: Positive for palpitations. Negative for chest pain, dyspnea on exertion and leg swelling.  Gastrointestinal: Negative  for melena.  Neurological: Positive for dizziness.   Objective  Blood pressure (!) 161/81, pulse 85, temperature 98.3 F (36.8 C), temperature source Temporal, resp. rate 17, height _0  (1.626 m), weight 135 lb (61.2 kg), SpO2 97 %.  Vitals with BMI 11/14/2020 11/14/2020 11/15/2019  Height - _1  _2   Weight - 135 lbs 128 lbs  BMI - 77.82 42.35  Systolic 361 443 154  Diastolic 81 81 72  Pulse 85 87 93     Physical Exam Constitutional:      General: She is not in acute distress.    Appearance: She is well-developed.  Neck:     Thyroid: No thyromegaly.     Vascular: No JVD.  Cardiovascular:     Rate and Rhythm: Normal rate and regular rhythm.     Pulses: Intact distal pulses.          Carotid pulses are 2+ on the right side and 2+ on the left side.      Radial pulses are 2+ on the right side and 2+ on the left side.       Dorsalis pedis pulses are 1+ on the right side and 1+ on the left side.       Posterior tibial pulses are 1+ on the right side and 1+ on the left side.     Heart sounds: S1 normal and S2 normal.  Murmur heard.  High-pitched blowing decrescendo early diastolic murmur is present with a grade of 2/4 radiating to the apex. No gallop.      Comments: No JVD.  There is no leg edema. Pulmonary:     Effort: Pulmonary effort is normal.     Breath sounds: Normal breath sounds.  Abdominal:     General: Bowel sounds are normal.     Palpations: Abdomen is soft.  Musculoskeletal:        General: Normal range of motion.     Cervical back: Neck supple.  Skin:    General: Skin is warm and dry.  Neurological:     Mental Status: She is alert.    Laboratory examination:   No results for input(s): NA, K, CL, CO2, GLUCOSE, BUN, CREATININE, CALCIUM, GFRNONAA, GFRAA in the last 8760 hours. CrCl cannot be calculated (Patient's most recent lab result is older than the maximum 21 days allowed.).  CMP Latest Ref Rng & Units 02/04/2019 12/27/2018 12/25/2018  Glucose 70 - 99 mg/dL 96  135(H) 115(H)  BUN 8 - 23 mg/dL _0 Creatinine 0.44 - 1.00 mg/dL 0.75 0.97 0.72  Sodium 135 - 145 mmol/L 138 139 139  Potassium 3.5 - 5.1 mmol/L 3.8 3.8 3.8  Chloride 98 - 111 mmol/L 102 105 102  CO2 22 - 32 mmol/L _1 Calcium 8.9 - 10.3 mg/dL 10.2 9.8 9.5  Total Protein 6.5 - 8.1 g/dL 7.2 - 7.9  Total Bilirubin 0.3 - 1.2 mg/dL 1.5(H) - 1.1  Alkaline Phos 38 - 126 U/L 45 - 49  AST 15 - 41 U/L 24 - 23  ALT 0 - 44 U/L 19 - 22   CBC Latest Ref Rng & Units 02/04/2019 12/27/2018 12/25/2018  WBC 4.0 - 10.5 K/uL 5.0 5.2 4.7  Hemoglobin 12.0 - 15.0 g/dL 15.0 14.7 15.9(H)  Hematocrit 36.0 - 46.0 % 45.9 45.8 48.0(H)  Platelets 150 - 400 K/uL 173 176 173   No results for input(s): TSH in the last 8760 hours. External labs:   Labs 09/10/2020:  A1c 6.1%.  TSH normal at 4.13.  Total cholesterol 218, triglycerides 126, HDL 52, LDL 143.  Non-HDL cholesterol 166.  Creatinine, Serum 0.700 MG/ 03/04/2020  Labs 03/07/2019:  Serum glucose 95 mg, BUN 13, creatinine 0.69, EGFR greater than 60 mL, CMP otherwise normal.  A1c 6.0%.  Sedimentation rate 10 mm/h. Total cholesterol 206, triglycerides 79, HDL 56, LDL 134.  LDL particle #1545. TSH 4.660, T4 normal at 1.10.  T3 2.7, normal. Medications and allergies   Allergies  Allergen Reactions  . Penicillins Swelling and Rash    Did it involve swelling of the face/tongue/throat, SOB, or low BP? No Did it involve sudden or severe rash/hives, skin peeling, or any reaction on the inside of your mouth or nose? Yes Did you need to seek medical attention at a hospital or doctor's office? No When did it last happen?Over 10 years ago If all above answers are "NO", may proceed with cephalosporin use.     Current Outpatient Medications on File Prior to Visit  Medication Sig Dispense Refill  . alendronate (FOSAMAX) 70 MG tablet Take 1 tablet by mouth daily.    . bimatoprost (LUMIGAN) 0.01 % SOLN Place 1 drop into the right eye at bedtime.     . Cholecalciferol (VITAMIN D3 PO) Take 1 tablet by mouth daily.    . clobetasol cream (TEMOVATE) 5.05 % Apply 1 application topically as needed.    Marland Kitchen  gabapentin (NEURONTIN) 100 MG capsule Take 1 capsule by mouth daily.    Marland Kitchen levothyroxine (SYNTHROID) 50 MCG tablet Take 25 mcg by mouth daily before breakfast.     . meclizine (ANTIVERT) 25 MG tablet Take 1 tablet by mouth daily.    . Multiple Vitamin (MULTIVITAMIN) tablet Take 1 tablet by mouth daily.    . Multiple Vitamins-Minerals (PRESERVISION/LUTEIN) CAPS Take 1 capsule by mouth daily.    . rosuvastatin (CRESTOR) 20 MG tablet Take 1 tablet by mouth daily.     No current facility-administered medications on file prior to visit.     Radiology:   MRI brain 07/07/2019: No acute intracranial abnormality  Age-appropriate atrophy. Moderate chronic microvascular ischemic changes  Cardiac Studies:     Treadmill stress test [07/29/2015]: Indication:RBBB, Abnormal ECG The patient exercised according to Bruce Protocol, Total time recorded 3:01 min achieving max heart rate of 132 which was 96% of MPHR for age and 4.64 METS of work. Normal BP response. Resting ECG showing a NSR with a RBBB. There was no ST-T changes of ischemia with exercise stress test. Stress terminated due to fatigue and THR >85% MPHR met. Rare PVC. Markedly reduced exercise capacity and aerobic threshold.  Event monitor 04/04/2019 - 04/17/2019.  Baseline sample showed Sinus Rhythm w/Artifact with a heart rate of 84.2 bpm. There were 0 critical, 0 serious, and 39 stable events that occurred.  Symptomatic transmissions of dyspnea and lightheadedness revealed sinus rhythm/sinus tachycardia.  No other significant arrhythmias noted.  Echocardiogram 05/12/2019: Left ventricle cavity is normal in size. Normal left ventricular wall thickness. Normal LV systolic function with EF 61%. Normal global wall motion. Doppler evidence of grade I (impaired) diastolic dysfunction, normal LAP.   Trileaflet aortic valve. Mild aortic valve leaflet calcification. Moderate (Grade II) aortic regurgitation. Moderate (Grade III) mitral regurgitation. Mild tricuspid regurgitation.  No evidence of pulmonary hypertension. Compared to previous study in 2017, mitral and tricuspid regurgitation increased in severity.   EKG:     EKG 11/14/2020: Normal sinus rhythm with rate of 79 bpm, left axis deviation, left anterior fascicular block.  Right bundle branch block bifascicular block.  Low-voltage complexes.  No significant change from 05/15/2019, 10/07/2018, 10/18/2016:  Assessment     ICD-10-CM   1. Bifascicular block  I45.2   2. Moderate aortic regurgitation  I35.1 hydrALAZINE (APRESOLINE) 25 MG tablet  3. Essential hypertension  I10 EKG 12-Lead    hydrALAZINE (APRESOLINE) 25 MG tablet  4. Dizziness of unknown etiology  R42     Meds ordered this encounter  Medications  . hydrALAZINE (APRESOLINE) 25 MG tablet    Sig: Take 1 tablet (25 mg total) by mouth 3 (three) times daily.    Dispense:  90 tablet    Refill:  2    Medications Discontinued During This Encounter  Medication Reason  . albuterol (VENTOLIN HFA) 108 (90 Base) MCG/ACT inhaler Error  . budesonide-formoterol (SYMBICORT) 80-4.5 MCG/ACT inhaler Error  . montelukast (SINGULAIR) 10 MG tablet Error   Orders Placed This Encounter  Procedures  . EKG 12-Lead    Recommendations:   Sonya Gross  is a 87 y.o. pleasant African-American female with mild to moderate aortic regurgitation, bifascicular block on the EKG, hypertension, mixed hyperlipidemia, presents here for follow-up of dizziness and palpitations.    Symptoms of palpitations are remained stable, she does have chronic dizziness unrelated to any position.  She has had MRI and CT scan of the brain which had not revealed any significant abnormality.  Pulses  in both upper upper extremity is normal and do not suspect subclavian steal.  She has bifascicular block on EKG  but essentially asymptomatic without any near syncope or syncope, will continue to monitor her closely.  She has no symptoms to suggest decreased exercise tolerance.  She does have moderate aortic regurgitation and moderate mitral regurgitation by echocardiogram in 2020 however auscultation reveals aortic regurgitation murmur but no MR, no significant change in physical exam or EKG, hence we will continue to observe this and consider an echocardiogram probably next year.  Her blood pressure is elevated today, I will add hydralazine 25 mg 3 times daily.  I would like to see her back in 6 weeks for follow-up of hypertension and if she remains stable on annual basis.  With regard to hyperlipidemia, she is on a high intensity 20 mg of Crestor daily and is being managed by PCP.   Adrian Prows, MD, [facility] 11/14/2020, 11:10 AM Office: [phone] Pager: [phone]

## 2020-11-21 DIAGNOSIS — I10 Essential (primary) hypertension: Secondary | ICD-10-CM | POA: Diagnosis not present

## 2020-11-21 DIAGNOSIS — E7800 Pure hypercholesterolemia, unspecified: Secondary | ICD-10-CM | POA: Diagnosis not present

## 2020-11-21 DIAGNOSIS — Z0000 Encounter for general adult medical examination without abnormal findings: Secondary | ICD-10-CM | POA: Diagnosis not present

## 2020-11-21 DIAGNOSIS — I700 Atherosclerosis of aorta: Secondary | ICD-10-CM | POA: Diagnosis not present

## 2020-11-21 DIAGNOSIS — R7303 Prediabetes: Secondary | ICD-10-CM | POA: Diagnosis not present

## 2020-11-21 DIAGNOSIS — T148XXA Other injury of unspecified body region, initial encounter: Secondary | ICD-10-CM | POA: Diagnosis not present

## 2020-11-21 DIAGNOSIS — E039 Hypothyroidism, unspecified: Secondary | ICD-10-CM | POA: Diagnosis not present

## 2020-11-21 DIAGNOSIS — M810 Age-related osteoporosis without current pathological fracture: Secondary | ICD-10-CM | POA: Diagnosis not present

## 2020-11-21 DIAGNOSIS — Z1231 Encounter for screening mammogram for malignant neoplasm of breast: Secondary | ICD-10-CM | POA: Diagnosis not present

## 2020-11-21 DIAGNOSIS — W19XXXA Unspecified fall, initial encounter: Secondary | ICD-10-CM | POA: Diagnosis not present

## 2020-11-21 DIAGNOSIS — M25462 Effusion, left knee: Secondary | ICD-10-CM | POA: Diagnosis not present

## 2020-11-25 DIAGNOSIS — E876 Hypokalemia: Secondary | ICD-10-CM | POA: Diagnosis not present

## 2020-11-26 DIAGNOSIS — H401131 Primary open-angle glaucoma, bilateral, mild stage: Secondary | ICD-10-CM | POA: Diagnosis not present

## 2020-11-26 DIAGNOSIS — H43813 Vitreous degeneration, bilateral: Secondary | ICD-10-CM | POA: Diagnosis not present

## 2020-11-27 ENCOUNTER — Other Ambulatory Visit: Payer: Self-pay | Admitting: Family Medicine

## 2020-11-27 DIAGNOSIS — M810 Age-related osteoporosis without current pathological fracture: Secondary | ICD-10-CM

## 2020-11-29 ENCOUNTER — Other Ambulatory Visit: Payer: Self-pay | Admitting: Family Medicine

## 2020-11-29 DIAGNOSIS — Z1231 Encounter for screening mammogram for malignant neoplasm of breast: Secondary | ICD-10-CM

## 2020-12-13 ENCOUNTER — Other Ambulatory Visit: Payer: Self-pay

## 2020-12-13 ENCOUNTER — Encounter (HOSPITAL_COMMUNITY): Payer: Self-pay

## 2020-12-13 ENCOUNTER — Ambulatory Visit (HOSPITAL_COMMUNITY)
Admission: EM | Admit: 2020-12-13 | Discharge: 2020-12-13 | Disposition: A | Discharge status: Home / Self Care | Payer: Medicare PPO | Attending: Emergency Medicine | Admitting: Emergency Medicine

## 2020-12-13 DIAGNOSIS — M25562 Pain in left knee: Secondary | ICD-10-CM

## 2020-12-13 DIAGNOSIS — S76912A Strain of unspecified muscles, fascia and tendons at thigh level, left thigh, initial encounter: Secondary | ICD-10-CM

## 2020-12-13 MED ORDER — NAPROXEN 500 MG PO TABS: 500.0000 mg | ORAL_TABLET | Freq: Two times a day (BID) | ORAL | 0 refills | Status: DC

## 2020-12-13 NOTE — ED Provider Notes (Signed)
[facility]    CSN: [id number] Arrival date & time: 12/13/20  6314      History   Chief Complaint Chief Complaint  Patient presents with  . Knee Pain  . Groin Pain    HPI Sonya Gross is a 87 y.o. female.   Patient here with complaint of left knee pain.  She reports falling about 2 weeks ago and developed left knee swelling.  Reports symptoms in left knee have mostly resolved.  About 1 week ago, developed left groin pain when walking.  Reports tried using a sports balm with minimal relief.  Movement makes pain worse and rest improves symptoms.  Describes pain as intermittent aching and pulling.  Denies any fevers, chest pain, shortness of breath, N/V/D, numbness, tingling, weakness, abdominal pain, or headaches.   ROS: As per HPI, all other pertinent ROS negative   The history is provided by the patient.  Knee Pain Groin Pain    Past Medical History:  Diagnosis Date  . Abnormal glucose   . Bifascicular block 04/03/2019  . Cataract   . Dizziness   . Dizziness   . Hot flashes   . HTN (hypertension) 04/03/2019  . Hyperlipidemia   . Multiple nodules of lung   . Nonrheumatic aortic (valve) insufficiency 04/03/2019  . Osteopenia   . Palpitations   . Thyroid disease   . Vitamin D deficiency     Patient Active Problem List   Diagnosis Date Noted  . Dizziness 08/02/2019  . Numbness and tingling 06/20/2019  . Flushing 06/20/2019  . Nonrheumatic aortic (valve) insufficiency 04/03/2019  . Bifascicular block 04/03/2019  . HTN (hypertension) 04/03/2019  . Hypothyroidism 02/26/2016  . Hyperlipidemia 12/05/2015    Past Surgical History:  Procedure Laterality Date  . ABDOMINAL HYSTERECTOMY    . APPENDECTOMY  1975  . CATARACT EXTRACTION, BILATERAL  2010  . CHOLECYSTECTOMY  2003    OB History   No obstetric history on file.      Home Medications    Prior to Admission medications   Medication Sig Start Date End Date Taking? Authorizing Provider   naproxen (NAPROSYN) 500 MG tablet Take 1 tablet (500 mg total) by mouth 2 (two) times daily. 12/13/20  Yes Ivette Loyal, NP  alendronate (FOSAMAX) 70 MG tablet Take 1 tablet by mouth daily.    [provider]  bimatoprost (LUMIGAN) 0.01 % SOLN Place 1 drop into the right eye at bedtime.    [provider]  Cholecalciferol (VITAMIN D3 PO) Take 1 tablet by mouth daily.    [provider]  clobetasol cream (TEMOVATE) 0.05 % Apply 1 application topically as needed.    [provider]  gabapentin (NEURONTIN) 100 MG capsule Take 1 capsule by mouth daily. 06/11/20   [provider]  hydrALAZINE (APRESOLINE) 25 MG tablet Take 1 tablet (25 mg total) by mouth 3 (three) times daily. 11/14/20 02/12/21  Yates Decamp, MD  levothyroxine (SYNTHROID) 50 MCG tablet Take 25 mcg by mouth daily before breakfast.     [provider]  meclizine (ANTIVERT) 25 MG tablet Take 1 tablet by mouth daily.    [provider]  Multiple Vitamin (MULTIVITAMIN) tablet Take 1 tablet by mouth daily.    [provider]  Multiple Vitamins-Minerals (PRESERVISION/LUTEIN) CAPS Take 1 capsule by mouth daily.    [provider]  rosuvastatin (CRESTOR) 20 MG tablet Take 1 tablet by mouth daily.    [provider]    Family History  Family History  Problem Relation Age of Onset  . Uterine cancer Mother        died at age 21  . Healthy Father        died at age 17  . Mental illness Sister     Social History Social History   Tobacco Use  . Smoking status: Never Smoker  . Smokeless tobacco: Never Used  Vaping Use  . Vaping Use: Never used  Substance Use Topics  . Alcohol use: Never    Alcohol/week: 0.0 standard drinks  . Drug use: Never     Allergies   Penicillins   Review of Systems Review of Systems  Musculoskeletal: Positive for arthralgias, joint swelling and myalgias.     Physical Exam Triage Vital Signs ED Triage Vitals  Enc  Vitals Group     BP 12/13/20 0831 137/77     Pulse Rate 12/13/20 0831 84     Resp 12/13/20 0831 18     Temp 12/13/20 0831 98.4 F (36.9 C)     Temp Source 12/13/20 0831 Oral     SpO2 12/13/20 0831 94 %     Weight --      Height --      Head Circumference --      Peak Flow --      Pain Score 12/13/20 0830 5     Pain Loc --      Pain Edu? --      Excl. in GC? --    No data found.  Updated Vital Signs BP 137/77 (BP Location: Right Arm)   Pulse 84   Temp 98.4 F (36.9 C) (Oral)   Resp 18   SpO2 94%   Visual Acuity Right Eye Distance:   Left Eye Distance:   Bilateral Distance:    Right Eye Near:   Left Eye Near:    Bilateral Near:     Physical Exam Vitals and nursing note reviewed.  Constitutional:      General: She is not in acute distress.    Appearance: Normal appearance. She is not ill-appearing, toxic-appearing or diaphoretic.  HENT:     Head: Normocephalic and atraumatic.  Eyes:     Conjunctiva/sclera: Conjunctivae normal.  Cardiovascular:     Rate and Rhythm: Normal rate.     Pulses: Normal pulses.  Pulmonary:     Effort: Pulmonary effort is normal.  Abdominal:     General: Abdomen is flat.  Musculoskeletal:        General: Normal range of motion.     Cervical back: Normal range of motion.     Right hip: Normal.     Left hip: Normal. No bony tenderness. Normal range of motion. Normal strength.     Right upper leg: Normal.     Left upper leg: No swelling, edema, tenderness or bony tenderness.     Comments: Reports pain and pulling when externally rotating hip, but no decreased ROM  Skin:    General: Skin is warm and dry.  Neurological:     General: No focal deficit present.     Mental Status: She is alert and oriented to person, place, and time.  Psychiatric:        Mood and Affect: Mood normal.      UC Treatments / Results  Labs (all labs ordered are listed, but only abnormal results are displayed) Labs Reviewed - No data to  display  EKG   Radiology No results found.  Procedures Procedures (  including critical care time)  Medications Ordered in UC Medications - No data to display  Initial Impression / Assessment and Plan / UC Course  I have reviewed the triage vital signs and the nursing notes.  Pertinent labs & imaging results that were available during my care of the patient were reviewed by me and considered in my medical decision making (see chart for details).     Acute pain of left knee Muscle strain of left thigh Assessment negative for red flags or concerns.   Take naproxen BID as needed for pain Rest leg and knee, ice to area for 10 to 15 minutes every 4-6 hours as needed for comfort. Follow-up with primary care and Ortho  Final Clinical Impressions(s) / UC Diagnoses   Final diagnoses:  Acute pain of left knee  Muscle strain of left thigh, initial encounter     Discharge Instructions     Take the naproxen twice a day as needed for pain.   Rest your thigh and knee.  You can apply ice for 10-15 minutes every 4-6 hours as needed for comfort.    Follow up with your primary care provider or return if your symptoms do not improve in the next few days.     ED Prescriptions    Medication Sig Dispense Auth. Provider   naproxen (NAPROSYN) 500 MG tablet Take 1 tablet (500 mg total) by mouth 2 (two) times daily. 30 tablet Ivette Loyal, NP     PDMP not reviewed this encounter.   Ivette Loyal, NP 12/13/20 0900

## 2020-12-13 NOTE — ED Triage Notes (Signed)
Pt reports swelling and pian in left knee after fall 3 weeks ago; pain in the groin area when walking for the past week.

## 2020-12-13 NOTE — Discharge Instructions (Addendum)
Take the naproxen twice a day as needed for pain.   Rest your thigh and knee.  You can apply ice for 10-15 minutes every 4-6 hours as needed for comfort.    Follow up with your primary care provider or return if your symptoms do not improve in the next few days.

## 2021-01-13 ENCOUNTER — Ambulatory Visit: Payer: Medicare PPO | Admitting: Cardiology

## 2021-01-13 ENCOUNTER — Encounter: Payer: Self-pay | Admitting: Cardiology

## 2021-01-13 ENCOUNTER — Other Ambulatory Visit: Payer: Self-pay

## 2021-01-13 VITALS — BP 139/75 | HR 88 | Temp 97.5°F | Resp 16 | Ht 64.0 in | Wt 132.0 lb

## 2021-01-13 DIAGNOSIS — I10 Essential (primary) hypertension: Secondary | ICD-10-CM | POA: Diagnosis not present

## 2021-01-13 DIAGNOSIS — I351 Nonrheumatic aortic (valve) insufficiency: Secondary | ICD-10-CM | POA: Diagnosis not present

## 2021-01-13 MED ORDER — HYDRALAZINE HCL 25 MG PO TABS: 25.0000 mg | ORAL_TABLET | Freq: Three times a day (TID) | ORAL | 3 refills | Status: DC

## 2021-01-13 NOTE — Progress Notes (Signed)
Primary Physician/Referring:  Caren Macadam, MD  Patient ID: Sonya Gross, female    DOB: 1930/05/31, 87 y.o.   MRN: [id number]  Chief Complaint  Patient presents with  . Hypertension  . Follow-up    6 weeks   HPI:    Sonya Gross  is a 87 y.o. pleasant African-American female with mild to moderate aortic regurgitation, bifascicular block on the EKG, hypertension, mixed hyperlipidemia, presents here for follow-up of dizziness and palpitations.  Symptoms of palpitations are remained stable, chronic dizziness unrelated to her position.  Event monitor on 04/04/2019 was unrevealing and echocardiogram on 05/12/2019 revealed normal LVEF and moderate aortic regurgitation and mitral regurgitation without pulmonary hypertension.  I had seen her 6 weeks ago and due to elevated blood pressure and also moderate aortic regurgitation I had started her on hydralazine which is tolerating and presents for follow-up.  She has not noticed any dizziness or side effects from the medication.  Past Medical History:  Diagnosis Date  . Abnormal glucose   . Bifascicular block 04/03/2019  . Cataract   . Dizziness   . Dizziness   . Hot flashes   . HTN (hypertension) 04/03/2019  . Hyperlipidemia   . Multiple nodules of lung   . Nonrheumatic aortic (valve) insufficiency 04/03/2019  . Osteopenia   . Palpitations   . Thyroid disease   . Vitamin D deficiency    Past Surgical History:  Procedure Laterality Date  . ABDOMINAL HYSTERECTOMY    . APPENDECTOMY  1975  . CATARACT EXTRACTION, BILATERAL  2010  . CHOLECYSTECTOMY  2003   Family History  Problem Relation Age of Onset  . Uterine cancer Mother        died at age 74  . Healthy Father        died at age 49  . Mental illness Sister     Social History   Tobacco Use  . Smoking status: Never Smoker  . Smokeless tobacco: Never Used  Substance Use Topics  . Alcohol use: Never    Alcohol/week: 0.0 standard drinks   ROS  Review of Systems   Cardiovascular: Positive for palpitations. Negative for chest pain, dyspnea on exertion and leg swelling.  Gastrointestinal: Negative for melena.  Neurological: Positive for dizziness.   Objective  Blood pressure 139/75, pulse 88, temperature (!) 97.5 F (36.4 C), temperature source Temporal, resp. rate 16, height $RemoveBe'5\' 4"'OPbfIrPYi$  (1.626 m), weight 132 lb (59.9 kg), SpO2 95 %.  Vitals with BMI 01/13/2021 12/13/2020 11/14/2020  Height $Remov'5\' 4"'mvBBcV$  - -  Weight 132 lbs - -  BMI 49.70 - -  Systolic 263 785 885  Diastolic 75 77 81  Pulse 88 84 85     Physical Exam Constitutional:      General: She is not in acute distress.    Appearance: She is well-developed.  Neck:     Thyroid: No thyromegaly.     Vascular: No JVD.  Cardiovascular:     Rate and Rhythm: Normal rate and regular rhythm.     Pulses: Intact distal pulses.          Carotid pulses are 2+ on the right side and 2+ on the left side.      Radial pulses are 2+ on the right side and 2+ on the left side.       Dorsalis pedis pulses are 1+ on the right side and 1+ on the left side.       Posterior tibial pulses are 1+  on the right side and 1+ on the left side.     Heart sounds: S1 normal and S2 normal. Murmur heard.  High-pitched blowing decrescendo early diastolic murmur is present with a grade of 2/4 radiating to the apex. No gallop.      Comments: No JVD.  There is no leg edema. Pulmonary:     Effort: Pulmonary effort is normal.     Breath sounds: Normal breath sounds.  Abdominal:     General: Bowel sounds are normal.     Palpations: Abdomen is soft.  Musculoskeletal:        General: Normal range of motion.     Cervical back: Neck supple.  Skin:    General: Skin is warm and dry.  Neurological:     Mental Status: She is alert.    Laboratory examination:   No results for input(s): NA, K, CL, CO2, GLUCOSE, BUN, CREATININE, CALCIUM, GFRNONAA, GFRAA in the last 8760 hours. CrCl cannot be calculated (Patient's most recent lab result is older  than the maximum 21 days allowed.).  CMP Latest Ref Rng & Units 02/04/2019 12/27/2018 12/25/2018  Glucose 70 - 99 mg/dL 96 135(H) 115(H)  BUN 8 - 23 mg/dL $Remove'10 12 15  'lQFWRAp$ Creatinine 0.44 - 1.00 mg/dL 0.75 0.97 0.72  Sodium 135 - 145 mmol/L 138 139 139  Potassium 3.5 - 5.1 mmol/L 3.8 3.8 3.8  Chloride 98 - 111 mmol/L 102 105 102  CO2 22 - 32 mmol/L $RemoveB'23 22 28  'kwhUjVZf$ Calcium 8.9 - 10.3 mg/dL 10.2 9.8 9.5  Total Protein 6.5 - 8.1 g/dL 7.2 - 7.9  Total Bilirubin 0.3 - 1.2 mg/dL 1.5(H) - 1.1  Alkaline Phos 38 - 126 U/L 45 - 49  AST 15 - 41 U/L 24 - 23  ALT 0 - 44 U/L 19 - 22   CBC Latest Ref Rng & Units 02/04/2019 12/27/2018 12/25/2018  WBC 4.0 - 10.5 K/uL 5.0 5.2 4.7  Hemoglobin 12.0 - 15.0 g/dL 15.0 14.7 15.9(H)  Hematocrit 36.0 - 46.0 % 45.9 45.8 48.0(H)  Platelets 150 - 400 K/uL 173 176 173   No results for input(s): TSH in the last 8760 hours. External labs:   Labs 09/10/2020:  A1c 6.1%.  TSH normal at 4.13.  Total cholesterol 218, triglycerides 126, HDL 52, LDL 143.  Non-HDL cholesterol 166.  Creatinine, Serum 0.700 MG/ 03/04/2020  Labs 03/07/2019:  Serum glucose 95 mg, BUN 13, creatinine 0.69, EGFR greater than 60 mL, CMP otherwise normal.  A1c 6.0%.  Sedimentation rate 10 mm/h. Total cholesterol 206, triglycerides 79, HDL 56, LDL 134.  LDL particle #1545. TSH 4.660, T4 normal at 1.10.  T3 2.7, normal. Medications and allergies   Allergies  Allergen Reactions  . Penicillins Swelling and Rash    Did it involve swelling of the face/tongue/throat, SOB, or low BP? No Did it involve sudden or severe rash/hives, skin peeling, or any reaction on the inside of your mouth or nose? Yes Did you need to seek medical attention at a hospital or doctor's office? No When did it last happen?Over 10 years ago If all above answers are "NO", may proceed with cephalosporin use.     Current Outpatient Medications on File Prior to Visit  Medication Sig Dispense Refill  . alendronate (FOSAMAX) 70 MG  tablet Take 1 tablet by mouth daily.    . bimatoprost (LUMIGAN) 0.01 % SOLN Place 1 drop into the right eye at bedtime.    . Cholecalciferol (VITAMIN D3 PO) Take 1  tablet by mouth daily.    . clobetasol cream (TEMOVATE) 5.99 % Apply 1 application topically as needed.    . gabapentin (NEURONTIN) 100 MG capsule Take 1 capsule by mouth daily.    Marland Kitchen levothyroxine (SYNTHROID) 25 MCG tablet Take 25 mcg by mouth daily before breakfast.     . meclizine (ANTIVERT) 25 MG tablet Take 1 tablet by mouth daily.    . Multiple Vitamin (MULTIVITAMIN) tablet Take 1 tablet by mouth daily.    . Multiple Vitamins-Minerals (PRESERVISION/LUTEIN) CAPS Take 1 capsule by mouth daily.    . rosuvastatin (CRESTOR) 20 MG tablet Take 1 tablet by mouth daily.    . B Complex Vitamins (VITAMIN B COMPLEX) TABS Take 1 tablet by mouth See admin instructions.     No current facility-administered medications on file prior to visit.     Radiology:   MRI brain 07/07/2019: No acute intracranial abnormality  Age-appropriate atrophy. Moderate chronic microvascular ischemic changes  Cardiac Studies:     Treadmill stress test [07/29/2015]: Indication:RBBB, Abnormal ECG The patient exercised according to Bruce Protocol, Total time recorded 3:01 min achieving max heart rate of 132 which was 96% of MPHR for age and 4.64 METS of work. Normal BP response. Resting ECG showing a NSR with a RBBB. There was no ST-T changes of ischemia with exercise stress test. Stress terminated due to fatigue and THR >85% MPHR met. Rare PVC. Markedly reduced exercise capacity and aerobic threshold.  Event monitor 04/04/2019 - 04/17/2019.  Baseline sample showed Sinus Rhythm w/Artifact with a heart rate of 84.2 bpm. There were 0 critical, 0 serious, and 39 stable events that occurred.  Symptomatic transmissions of dyspnea and lightheadedness revealed sinus rhythm/sinus tachycardia.  No other significant arrhythmias noted.  Echocardiogram 05/12/2019: Left  ventricle cavity is normal in size. Normal left ventricular wall thickness. Normal LV systolic function with EF 61%. Normal global wall motion. Doppler evidence of grade I (impaired) diastolic dysfunction, normal LAP.  Trileaflet aortic valve. Mild aortic valve leaflet calcification. Moderate (Grade II) aortic regurgitation. Moderate (Grade III) mitral regurgitation. Mild tricuspid regurgitation.  No evidence of pulmonary hypertension. Compared to previous study in 2017, mitral and tricuspid regurgitation increased in severity.   EKG:     EKG 11/14/2020: Normal sinus rhythm with rate of 79 bpm, left axis deviation, left anterior fascicular block.  Right bundle branch block bifascicular block.  Low-voltage complexes.  No significant change from 05/15/2019, 10/07/2018, 10/18/2016:  Assessment     ICD-10-CM   1. Essential hypertension  I10 hydrALAZINE (APRESOLINE) 25 MG tablet  2. Moderate aortic regurgitation  I35.1 hydrALAZINE (APRESOLINE) 25 MG tablet    Meds ordered this encounter  Medications  . hydrALAZINE (APRESOLINE) 25 MG tablet    Sig: Take 1 tablet (25 mg total) by mouth 3 (three) times daily.    Dispense:  270 tablet    Refill:  3    Medications Discontinued During This Encounter  Medication Reason  . naproxen (NAPROSYN) 500 MG tablet Error  . hydrALAZINE (APRESOLINE) 25 MG tablet Reorder   No orders of the defined types were placed in this encounter.   Recommendations:   Sonya Gross  is a 87 y.o. pleasant African-American female with mild to moderate aortic regurgitation, bifascicular block on the EKG, hypertension, mixed hyperlipidemia, presents here for follow-up of dizziness and palpitations.  Symptoms of palpitations are remained stable, chronic dizziness unrelated to her position.  Event monitor on 04/04/2019 was unrevealing and echocardiogram on 05/12/2019 revealed normal LVEF and  moderate aortic regurgitation and mitral regurgitation without pulmonary  hypertension.  I had seen her 6 weeks ago and due to elevated blood pressure and also moderate aortic regurgitation I had started her on hydralazine which is tolerating and presents for follow-up.  Her blood pressure is now well controlled.  She remains asymptomatic.  No changes in the medications were done today, hydralazine prescription was refilled.  She has bifascicular block on EKG but essentially asymptomatic without any near syncope or syncope, will continue to monitor her closely.  She has no symptoms to suggest decreased exercise tolerance.  I will see her back on annual basis.   Adrian Prows, MD, [facility] 01/13/2021, 10:09 AM Office: [phone] Pager: [phone]

## 2021-02-19 DIAGNOSIS — S8002XA Contusion of left knee, initial encounter: Secondary | ICD-10-CM | POA: Diagnosis not present

## 2021-02-24 DIAGNOSIS — Z1231 Encounter for screening mammogram for malignant neoplasm of breast: Secondary | ICD-10-CM | POA: Diagnosis not present

## 2021-02-24 DIAGNOSIS — Z780 Asymptomatic menopausal state: Secondary | ICD-10-CM | POA: Diagnosis not present

## 2021-02-24 DIAGNOSIS — M8588 Other specified disorders of bone density and structure, other site: Secondary | ICD-10-CM | POA: Diagnosis not present

## 2021-03-05 DIAGNOSIS — M6281 Muscle weakness (generalized): Secondary | ICD-10-CM | POA: Diagnosis not present

## 2021-03-05 DIAGNOSIS — M25562 Pain in left knee: Secondary | ICD-10-CM | POA: Diagnosis not present

## 2021-03-05 DIAGNOSIS — S8002XD Contusion of left knee, subsequent encounter: Secondary | ICD-10-CM | POA: Diagnosis not present

## 2021-03-11 DIAGNOSIS — M25562 Pain in left knee: Secondary | ICD-10-CM | POA: Diagnosis not present

## 2021-03-11 DIAGNOSIS — S8002XD Contusion of left knee, subsequent encounter: Secondary | ICD-10-CM | POA: Diagnosis not present

## 2021-03-11 DIAGNOSIS — M6281 Muscle weakness (generalized): Secondary | ICD-10-CM | POA: Diagnosis not present

## 2021-03-13 DIAGNOSIS — S8002XD Contusion of left knee, subsequent encounter: Secondary | ICD-10-CM | POA: Diagnosis not present

## 2021-03-13 DIAGNOSIS — M25562 Pain in left knee: Secondary | ICD-10-CM | POA: Diagnosis not present

## 2021-03-13 DIAGNOSIS — M6281 Muscle weakness (generalized): Secondary | ICD-10-CM | POA: Diagnosis not present

## 2021-03-20 DIAGNOSIS — M6281 Muscle weakness (generalized): Secondary | ICD-10-CM | POA: Diagnosis not present

## 2021-03-20 DIAGNOSIS — M25562 Pain in left knee: Secondary | ICD-10-CM | POA: Diagnosis not present

## 2021-03-20 DIAGNOSIS — S8002XD Contusion of left knee, subsequent encounter: Secondary | ICD-10-CM | POA: Diagnosis not present

## 2021-03-31 DIAGNOSIS — S8002XD Contusion of left knee, subsequent encounter: Secondary | ICD-10-CM | POA: Diagnosis not present

## 2021-03-31 DIAGNOSIS — M6281 Muscle weakness (generalized): Secondary | ICD-10-CM | POA: Diagnosis not present

## 2021-03-31 DIAGNOSIS — M25562 Pain in left knee: Secondary | ICD-10-CM | POA: Diagnosis not present

## 2021-04-02 DIAGNOSIS — M25562 Pain in left knee: Secondary | ICD-10-CM | POA: Diagnosis not present

## 2021-04-02 DIAGNOSIS — S8002XD Contusion of left knee, subsequent encounter: Secondary | ICD-10-CM | POA: Diagnosis not present

## 2021-04-02 DIAGNOSIS — M6281 Muscle weakness (generalized): Secondary | ICD-10-CM | POA: Diagnosis not present

## 2021-04-08 DIAGNOSIS — M25562 Pain in left knee: Secondary | ICD-10-CM | POA: Diagnosis not present

## 2021-04-08 DIAGNOSIS — M6281 Muscle weakness (generalized): Secondary | ICD-10-CM | POA: Diagnosis not present

## 2021-04-08 DIAGNOSIS — S8002XD Contusion of left knee, subsequent encounter: Secondary | ICD-10-CM | POA: Diagnosis not present

## 2021-04-16 DIAGNOSIS — M25562 Pain in left knee: Secondary | ICD-10-CM | POA: Diagnosis not present

## 2021-05-13 ENCOUNTER — Other Ambulatory Visit: Payer: Medicare PPO

## 2021-05-13 ENCOUNTER — Ambulatory Visit: Payer: Medicare PPO

## 2021-05-23 DIAGNOSIS — E7800 Pure hypercholesterolemia, unspecified: Secondary | ICD-10-CM | POA: Diagnosis not present

## 2021-05-23 DIAGNOSIS — R42 Dizziness and giddiness: Secondary | ICD-10-CM | POA: Diagnosis not present

## 2021-05-23 DIAGNOSIS — E039 Hypothyroidism, unspecified: Secondary | ICD-10-CM | POA: Diagnosis not present

## 2021-05-23 DIAGNOSIS — I700 Atherosclerosis of aorta: Secondary | ICD-10-CM | POA: Diagnosis not present

## 2021-05-23 DIAGNOSIS — I10 Essential (primary) hypertension: Secondary | ICD-10-CM | POA: Diagnosis not present

## 2021-05-23 DIAGNOSIS — Z23 Encounter for immunization: Secondary | ICD-10-CM | POA: Diagnosis not present

## 2021-05-23 DIAGNOSIS — M810 Age-related osteoporosis without current pathological fracture: Secondary | ICD-10-CM | POA: Diagnosis not present

## 2021-05-28 DIAGNOSIS — H409 Unspecified glaucoma: Secondary | ICD-10-CM | POA: Diagnosis not present

## 2021-05-28 DIAGNOSIS — Z7722 Contact with and (suspected) exposure to environmental tobacco smoke (acute) (chronic): Secondary | ICD-10-CM | POA: Diagnosis not present

## 2021-05-28 DIAGNOSIS — E785 Hyperlipidemia, unspecified: Secondary | ICD-10-CM | POA: Diagnosis not present

## 2021-05-28 DIAGNOSIS — H8390 Unspecified disease of inner ear, unspecified ear: Secondary | ICD-10-CM | POA: Diagnosis not present

## 2021-05-28 DIAGNOSIS — I10 Essential (primary) hypertension: Secondary | ICD-10-CM | POA: Diagnosis not present

## 2021-05-28 DIAGNOSIS — M810 Age-related osteoporosis without current pathological fracture: Secondary | ICD-10-CM | POA: Diagnosis not present

## 2021-05-28 DIAGNOSIS — Z7983 Long term (current) use of bisphosphonates: Secondary | ICD-10-CM | POA: Diagnosis not present

## 2021-05-28 DIAGNOSIS — H3530 Unspecified macular degeneration: Secondary | ICD-10-CM | POA: Diagnosis not present

## 2021-05-28 DIAGNOSIS — E039 Hypothyroidism, unspecified: Secondary | ICD-10-CM | POA: Diagnosis not present

## 2021-06-05 DIAGNOSIS — H401131 Primary open-angle glaucoma, bilateral, mild stage: Secondary | ICD-10-CM | POA: Diagnosis not present

## 2021-06-05 DIAGNOSIS — H43813 Vitreous degeneration, bilateral: Secondary | ICD-10-CM | POA: Diagnosis not present

## 2021-08-04 ENCOUNTER — Other Ambulatory Visit: Payer: Self-pay

## 2021-08-04 ENCOUNTER — Ambulatory Visit: Payer: Medicare PPO | Admitting: Neurology

## 2021-08-04 ENCOUNTER — Encounter: Payer: Self-pay | Admitting: Neurology

## 2021-08-04 VITALS — BP 133/75 | HR 75 | Ht 66.0 in | Wt 137.5 lb

## 2021-08-04 DIAGNOSIS — R42 Dizziness and giddiness: Secondary | ICD-10-CM | POA: Diagnosis not present

## 2021-08-04 DIAGNOSIS — R269 Unspecified abnormalities of gait and mobility: Secondary | ICD-10-CM | POA: Insufficient documentation

## 2021-08-04 DIAGNOSIS — R519 Headache, unspecified: Secondary | ICD-10-CM | POA: Diagnosis not present

## 2021-08-04 NOTE — Progress Notes (Signed)
Chief Complaint  Patient presents with   New Patient (Initial Visit)    Rm 15. Accompanied by daughter. PCP is Dr. Caren Macadam. Patient c/o a few headache days a month. Pt states she has dizziness upon waking.       ASSESSMENT AND PLAN  Sonya Gross is a 87 y.o. female   Worsening headache  ESR C-reactive protein to rule out temporal arteritis  As needed Tylenol/NSAIDs  Gait abnormality  Hyperreflexia, need to rule out cervical spondylitic myelopathy  Referred to physical therapy   DIAGNOSTIC DATA (LABS, IMAGING, TESTING) - I reviewed patient records, labs, notes, testing and imaging myself where available. Laboratory evaluation in September 2020; normal free T4, TSH was mildly elevated at 5.64, normal CMP creatinine of 0.71, A1c 6.1, vitamin D 57.3, LDL 143  MEDICAL HISTORY:  Sonya Gross is a 87 year old female, seen in request by her primary care physician Dr. Mannie Stabile, Apolonio Schneiders, for evaluation of right-sided headache, dizziness,  I reviewed and summarized the referring note.  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 89, fell down steps, lost consciousness for extended period of time, 2 weeks per report,  Since then, she had intermittent headaches, getting worse since 2020, she felt something hollow at the right temporoparietal region, occasionally painful, couple times each week, relieved by resting, over-the-counter medication  She denies jaw claudication, no drooling swallowing difficulty, no diffuse body achy pain,  Since 2020, she also complains of intermittent dizziness, it is not vertigo, lightheadedness when getting up from prolonged sitting, also complains of unsteadiness, denies bowel and bladder incontinence  Blood pressure today does not demonstrate orthostatic changes, sitting down 149/79, heart rate of 68; standing up 159/84  heart rate of 73; standing up for 1 minute 158/82 heart rate of 73  Personally reviewed MRI of the brain October 2020, no acute intracranial abnormality, atrophy, microsmall vessel disease   PHYSICAL EXAM:   Vitals:   08/04/21 1423  BP: 133/75  Pulse: 75  Weight: 137 lb 8 oz (62.4 kg)  Height: $Remove'5\' 6"'JDhGnnA$  (1.676 m)   Not recorded     Body mass index is 22.19 kg/m.  PHYSICAL EXAMNIATION:  Gen: NAD, conversant, well nourised, well groomed                     Cardiovascular: Regular rate rhythm, no peripheral edema, warm, nontender. Eyes: Conjunctivae clear without exudates or hemorrhage Neck: Supple, no carotid bruits. Pulmonary: Clear to auscultation bilaterally   NEUROLOGICAL EXAM:  MENTAL STATUS: Speech    Speech is normal; fluent and spontaneous with normal comprehension.  Cognition:     Orientation to time, place and person     Normal recent and remote memory     Normal Attention span and concentration     Normal Language, naming, repeating,spontaneous speech     Fund of knowledge   CRANIAL NERVES: CN II: Visual fields are full to confrontation. Pupils are round equal and briskly reactive to light. CN III, IV, VI: extraocular movement are normal. No ptosis. CN V: Facial sensation is intact to light touch CN VII: Face is symmetric with normal eye closure  CN VIII: Hearing is normal to causal conversation. CN IX, X: Phonation is normal. CN XI: Head turning and shoulder shrug are intact  MOTOR: There is no pronator drift of out-stretched arms. Muscle bulk and tone are normal. Muscle strength is normal.  REFLEXES: Reflexes are 2+ and symmetric at the biceps, triceps, 3/3 knees, and ankles. Plantar responses are flexor.  SENSORY: Intact to light touch, pinprick and vibratory sensation are intact in fingers and toes.  COORDINATION: There is no trunk or limb dysmetria noted.  GAIT/STANCE: Need push-up to get up from seated position, cautious, stiff,  wide-based  REVIEW OF SYSTEMS:  Full 14 system review of systems performed and notable only for as above All other review of systems were negative.   ALLERGIES: Allergies  Allergen Reactions   Shellfish Allergy Itching    Pt's daughter states she had to go to the ER.   Penicillins Swelling and Rash    Did it involve swelling of the face/tongue/throat, SOB, or low BP? No Did it involve sudden or severe rash/hives, skin peeling, or any reaction on the inside of your mouth or nose? Yes Did you need to seek medical attention at a hospital or doctor's office? No When did it last happen?     Over 10 years ago  If all above answers are "NO", may proceed with cephalosporin use.     HOME MEDICATIONS: Current Outpatient Medications  Medication Sig Dispense Refill   alendronate (FOSAMAX) 70 MG tablet Take 1 tablet by mouth daily.     B Complex Vitamins (VITAMIN B COMPLEX) TABS Take 1 tablet by mouth See admin instructions.     bimatoprost (LUMIGAN) 0.01 % SOLN Place 1 drop into the right eye at bedtime.     Cholecalciferol (VITAMIN D3 PO) Take 1 tablet by mouth daily.     clobetasol cream (TEMOVATE) 0.93 % Apply 1 application topically as needed.     gabapentin (NEURONTIN) 100 MG capsule Take 1 capsule by mouth daily.     hydrALAZINE (APRESOLINE) 25 MG tablet Take 1 tablet (25 mg total) by mouth 3 (three) times daily. 270 tablet 3   levothyroxine (SYNTHROID) 25 MCG tablet Take 25 mcg by mouth daily before breakfast.      meclizine (ANTIVERT) 25 MG tablet Take 1 tablet by mouth daily.     Multiple Vitamins-Minerals (PRESERVISION/LUTEIN) CAPS Take 1 capsule by mouth daily.     rosuvastatin (CRESTOR) 20 MG tablet Take 1 tablet by mouth daily.     No current facility-administered medications for this visit.    PAST MEDICAL HISTORY: Past Medical History:  Diagnosis Date   Abnormal glucose    Bifascicular block 04/03/2019   Cataract    Dizziness    Dizziness    Hot flashes    HTN  (hypertension) 04/03/2019   Hyperlipidemia    Multiple nodules of lung    Nonrheumatic aortic (valve) insufficiency 04/03/2019   Osteopenia    Palpitations    Thyroid disease    Vitamin D deficiency     PAST SURGICAL HISTORY: Past Surgical History:  Procedure Laterality Date   ABDOMINAL HYSTERECTOMY     APPENDECTOMY  1975   CATARACT EXTRACTION, BILATERAL  2010   CHOLECYSTECTOMY  2003    FAMILY HISTORY: Family History  Problem Relation Age of Onset   Uterine cancer Mother        died at age 50   Healthy Father        died at age 19   Mental illness Sister     SOCIAL HISTORY: Social History   Socioeconomic History   Marital status: Widowed    Spouse name: Not on file   Number of children: 2   Years of education: college - masters in biology  Highest education level: Master's degree (e.g., MA, MS, MEng, MEd, MSW, MBA)  Occupational History   Occupation: Retired   Tobacco Use   Smoking status: Never   Smokeless tobacco: Never  Vaping Use   Vaping Use: Never used  Substance and Sexual Activity   Alcohol use: Never    Alcohol/week: 0.0 standard drinks   Drug use: Never   Sexual activity: Not on file  Other Topics Concern   Not on file  Social History Narrative   Lives alone.   One cup caffeine use.   Right-handed.   Social Determinants of Health   Financial Resource Strain: Not on file  Food Insecurity: Not on file  Transportation Needs: Not on file  Physical Activity: Not on file  Stress: Not on file  Social Connections: Not on file  Intimate Partner Violence: Not on file      Marcial Pacas, M.D. Ph.D.  [facility] [address] [place], [place] [address] Ph: [phone] Fax: [phone]  CC:  Caren Macadam, MD [place],  [place] [id number]  Caren Macadam, MD

## 2021-08-05 LAB — C-REACTIVE PROTEIN: CRP: 1 mg/L (ref 0–10)

## 2021-08-05 LAB — SEDIMENTATION RATE: Sed Rate: 6 mm/hr (ref 0–40)

## 2021-08-18 ENCOUNTER — Other Ambulatory Visit: Payer: Self-pay

## 2021-08-18 ENCOUNTER — Encounter: Payer: Self-pay | Admitting: Physical Therapy

## 2021-08-18 ENCOUNTER — Ambulatory Visit: Payer: Medicare PPO | Attending: Neurology | Admitting: Physical Therapy

## 2021-08-18 VITALS — BP 129/61 | HR 85

## 2021-08-18 DIAGNOSIS — R2689 Other abnormalities of gait and mobility: Secondary | ICD-10-CM | POA: Diagnosis not present

## 2021-08-18 DIAGNOSIS — R519 Headache, unspecified: Secondary | ICD-10-CM | POA: Diagnosis not present

## 2021-08-18 DIAGNOSIS — R2681 Unsteadiness on feet: Secondary | ICD-10-CM | POA: Diagnosis not present

## 2021-08-18 DIAGNOSIS — R269 Unspecified abnormalities of gait and mobility: Secondary | ICD-10-CM | POA: Diagnosis not present

## 2021-08-18 DIAGNOSIS — R42 Dizziness and giddiness: Secondary | ICD-10-CM | POA: Diagnosis not present

## 2021-08-18 NOTE — Therapy (Signed)
[facility] [address] [place], [place], [address] Phone: [phone]   Fax:  [phone]  Physical Therapy Evaluation  Patient Details  Name: Sonya Gross MRN: [id number] Date of Birth: 1930/04/14 Referring Provider (PT): Dr. Levert Feinstein   Encounter Date: 08/18/2021   PT End of Session - 08/18/21 0953     Visit Number 1    Number of Visits 5    Date for PT Re-Evaluation 10/03/21    Authorization Type 08-18-21 - 10-14-21    PT Start Time 0846    PT Stop Time 0931    PT Time Calculation (min) 45 min    Activity Tolerance Patient tolerated treatment well    Behavior During Therapy [facility] for tasks assessed/performed             Past Medical History:  Diagnosis Date   Abnormal glucose    Bifascicular block 04/03/2019   Cataract    Dizziness    Dizziness    Hot flashes    HTN (hypertension) 04/03/2019   Hyperlipidemia    Multiple nodules of lung    Nonrheumatic aortic (valve) insufficiency 04/03/2019   Osteopenia    Palpitations    Thyroid disease    Vitamin D deficiency     Past Surgical History:  Procedure Laterality Date   ABDOMINAL HYSTERECTOMY     APPENDECTOMY  1975   CATARACT EXTRACTION, BILATERAL  2010   CHOLECYSTECTOMY  2003    Vitals:   08/18/21 0918  BP: 129/61  Pulse: 85      Subjective Assessment - 08/18/21 0850     Subjective Pt reports dizziness started in March 2020 - feels like her right ear is closing up - "a funny feeling"; saw ENT last year but states they did not find anything; pt reports occasional headache. Reports she has some dizziness every day - has to hold onto something when she gets up. Sometimes she stumbles - hurt Lt knee last year when she fell over uneven pavement in a parking lot. Pt has MRI cervical spine scheduled on Friday, 08-22-21 to r/o spondylitic cervical myelopathy.    Patient Stated Goals reduce the dizziness if possible    Currently in Pain? No/denies                 [facility] PT Assessment - 08/18/21 0855       Assessment   Medical Diagnosis Dizziness:  Gait abnormality    Referring Provider (PT) Dr. Levert Feinstein    Onset Date/Surgical Date --   March 202   Next MD Visit 11-11-21    Prior Therapy none for dizziness      Precautions   Precautions Other (comment)   Dizziness in the am     Restrictions   Weight Bearing Restrictions No      Balance Screen   Has the patient fallen in the past 6 months No    Has the patient had a decrease in activity level because of a fear of falling?  No    Is the patient reluctant to leave their home because of a fear of falling?  No      Home Environment   Living Environment Private residence    Type of Home House      Prior Function   Level of Independence Independent    Vocation Retired      Observation/Other Assessments   Focus on Therapeutic Outcomes (FOTO)  was not captured by front office  Ambulation/Gait   Ambulation/Gait Yes    Ambulation/Gait Assistance 6: Modified independent (Device/Increase time)    Ambulation Distance (Feet) 50 Feet    Assistive device None    Gait Pattern Within Functional Limits    Ambulation Surface Level;Indoor    Gait Comments pt has mild unsteadiness with amb. with horizontal head turns; pt reports unsteadiness at times                    Vestibular Assessment - 08/18/21 0858       Vestibular Assessment   General Observation Pt is an 87 yr old lady amb. independently without device      Symptom Behavior   Subjective history of current problem Pt reports dizziness started in March 2020 - c/o fullness in Rt ear; states she saw ENT who did not find a cause or a diagnosis, stated her hearing was very good with no hearing loss documented    Type of Dizziness  Imbalance;Unsteady with head/body turns   funny feeling in left ear   Frequency of Dizziness daily    Duration of Dizziness pt states the ear fullness is mostly constant     Symptom Nature Motion provoked    Aggravating Factors Sit to stand    Relieving Factors No known relieving factors;Lying supine   no known factors for the ear fullness; no dizziness reported with lying supine   Progression of Symptoms Better    History of similar episodes started in March 2020      Oculomotor Exam   Oculomotor Alignment Normal    Spontaneous Absent    Smooth Pursuits Intact    Saccades Intact      Positional Testing   Sidelying Test Sidelying Right;Sidelying Left      Sidelying Right   Sidelying Right Duration none    Sidelying Right Symptoms No nystagmus      Sidelying Left   Sidelying Left Duration none    Sidelying Left Symptoms No nystagmus      Positional Sensitivities   Sit to Supine No dizziness    Supine to Sitting Lightheadedness    Head Turning x 5 No dizziness    Head Nodding x 5 No dizziness    Rolling Right No dizziness    Rolling Left No dizziness      Orthostatics   BP supine (x 5 minutes) 132/66    HR supine (x 5 minutes) 84    BP sitting 125/68    HR sitting 87            Romberg EO 30 secs:  EC 30 secs     Objective measurements completed on examination: See above findings.                PT Education - 08/18/21 0950     Education Details eval results- etiology of Rt ear pressure is unknown    Person(s) Educated Patient    Methods Explanation    Comprehension Verbalized understanding                 PT Long Term Goals - 08/18/21 1939       PT LONG TERM GOAL #1   Title Pt will improve DGI score by at least 3 points to demo improved balance with gait.    Time 4    Period Weeks    Status New    Target Date 09/26/21   delay 1 week starting due to no schedule availability  PT LONG TERM GOAL #2   Title Pt will report at least 25% improvement in dizziness for improved quality of life.    Time 4    Period Weeks    Status New    Target Date 09/26/21      PT LONG TERM GOAL #3   Title Independent  in HEP for balance and vestibular exercises.    Time 4    Period Weeks    Status New    Target Date 09/26/21                    Plan - 08/18/21 0955     Clinical Impression Statement Pt is an 87 yr old lady with c/o Rt ear fullness, dizziness/light-headedness upon sitting and occasionally with standing, and intermittent headaches.  Pt reported light-headedness with return to seated position from both Rt and Lt sidelying and from supine to sitting. No nystagmus noted with any positional testing and pt reported no true spinning vertigo with any positional testing.  Pt did have some unsteadiness with amb. with head turns.  Pt will benefit from PT to address balance and gait deficits and dizziness with transitional movements.    Personal Factors and Comorbidities Age;Past/Current Experience;Time since onset of injury/illness/exacerbation;Comorbidity 2    Comorbidities Nonrheumatic aortic valave insufficiency, osteopenia, Vitamin D deficiency, HTN    Examination-Activity Limitations Locomotion Level;Transfers;Stairs;Squat    Examination-Participation Restrictions Cleaning;Laundry;Meal Prep;Shop    Stability/Clinical Decision Making Evolving/Moderate complexity    Clinical Decision Making Moderate    Rehab Potential Good    PT Frequency 1x / week    PT Duration 4 weeks    PT Treatment/Interventions ADLs/Self Care Home Management;Gait training;Stair training;Therapeutic activities;Therapeutic exercise;Balance training;Neuromuscular re-education;Patient/family education;Vestibular    PT Next Visit Plan do TUG, and DGI; begin HEP for balance    Consulted and Agree with Plan of Care Patient             Patient will benefit from skilled therapeutic intervention in order to improve the following deficits and impairments:  Difficulty walking, Decreased balance, Dizziness  Visit Diagnosis: Dizziness and giddiness - Plan: PT plan of care cert/re-cert  Other abnormalities of gait and  mobility - Plan: PT plan of care cert/re-cert  Unsteadiness on feet - Plan: PT plan of care cert/re-cert     Problem List Patient Active Problem List   Diagnosis Date Noted   Nonintractable headache 08/04/2021   Gait abnormality 08/04/2021   Dizziness 08/02/2019   Numbness and tingling 06/20/2019   Flushing 06/20/2019   Nonrheumatic aortic (valve) insufficiency 04/03/2019   Bifascicular block 04/03/2019   HTN (hypertension) 04/03/2019   Hypothyroidism 02/26/2016   Hyperlipidemia 12/05/2015    Kary Kos, PT 08/18/2021, 7:47 PM   [facility] [address] [place], [place], [address] Phone: [phone]   Fax:  [phone]  Name: Sonya Gross MRN: [id number] Date of Birth: [birth year]

## 2021-08-19 DIAGNOSIS — M8589 Other specified disorders of bone density and structure, multiple sites: Secondary | ICD-10-CM | POA: Diagnosis not present

## 2021-08-19 DIAGNOSIS — Z23 Encounter for immunization: Secondary | ICD-10-CM | POA: Diagnosis not present

## 2021-08-19 DIAGNOSIS — I10 Essential (primary) hypertension: Secondary | ICD-10-CM | POA: Diagnosis not present

## 2021-08-19 DIAGNOSIS — E039 Hypothyroidism, unspecified: Secondary | ICD-10-CM | POA: Diagnosis not present

## 2021-08-19 DIAGNOSIS — R7303 Prediabetes: Secondary | ICD-10-CM | POA: Diagnosis not present

## 2021-08-19 DIAGNOSIS — E7800 Pure hypercholesterolemia, unspecified: Secondary | ICD-10-CM | POA: Diagnosis not present

## 2021-08-19 DIAGNOSIS — R42 Dizziness and giddiness: Secondary | ICD-10-CM | POA: Diagnosis not present

## 2021-08-22 ENCOUNTER — Other Ambulatory Visit: Payer: Self-pay | Admitting: Neurology

## 2021-08-22 ENCOUNTER — Ambulatory Visit
Admission: RE | Admit: 2021-08-22 | Discharge: 2021-08-22 | Disposition: A | Discharge status: Home / Self Care | Payer: Medicare PPO | Source: Ambulatory Visit | Attending: Neurology | Admitting: Neurology

## 2021-08-22 DIAGNOSIS — R42 Dizziness and giddiness: Secondary | ICD-10-CM

## 2021-08-22 DIAGNOSIS — R269 Unspecified abnormalities of gait and mobility: Secondary | ICD-10-CM

## 2021-08-22 DIAGNOSIS — R519 Headache, unspecified: Secondary | ICD-10-CM

## 2021-08-24 ENCOUNTER — Other Ambulatory Visit: Payer: Self-pay

## 2021-08-24 ENCOUNTER — Ambulatory Visit
Admission: RE | Admit: 2021-08-24 | Discharge: 2021-08-24 | Disposition: A | Discharge status: Home / Self Care | Payer: Medicare PPO | Source: Ambulatory Visit | Attending: Neurology | Admitting: Neurology

## 2021-08-24 DIAGNOSIS — R42 Dizziness and giddiness: Secondary | ICD-10-CM

## 2021-08-24 DIAGNOSIS — R269 Unspecified abnormalities of gait and mobility: Secondary | ICD-10-CM | POA: Diagnosis not present

## 2021-08-24 DIAGNOSIS — R519 Headache, unspecified: Secondary | ICD-10-CM | POA: Diagnosis not present

## 2021-08-25 ENCOUNTER — Telehealth: Payer: Self-pay | Admitting: Neurology

## 2021-08-25 NOTE — Telephone Encounter (Signed)
I spoke to the patient's daughter on [place]. She verbalized understanding of the MRI findings and lab results. Reports she has been making sure her mother has increased her water intake and staying well hydrated. This change alone has improved her headaches. She will have her mother keep her pending appt in February and call us sooner, if needed.

## 2021-08-25 NOTE — Telephone Encounter (Signed)
Please call patient, MRI of the cervical spine showed multilevel degenerative changes, most obvious at C2-3 level, with moderate spinal stenosis, no cord signal abnormality  There is also variable degree of foraminal narrowing  Normal ESR C-reactive protein, no evidence of temporal arteritis.  Please also check on her headache     IMPRESSION: This MRI of the cervical spine without contrast shows the following: 1.   The spinal cord appears normal. 2.   At C2-C3, there is moderate spinal stenosis due to degenerative changes including right paramedian disc protrusion.  There is no nerve root compression. 3.   At C3-C4, there is mild spinal stenosis due to degenerative changes and moderately severe left foraminal narrowing with potential for left C4 nerve root compression. 4.   At C5-C6, there is mild spinal stenosis due to degenerative changes and moderately severe right foraminal narrowing with potential for right C6 nerve root compression. 5.   Milder degenerative changes at C4-C5 and C6-C7 do not lead to spinal stenosis or nerve root compression. 

## 2021-08-25 NOTE — Telephone Encounter (Signed)
Left message requesting a return call.

## 2021-09-01 ENCOUNTER — Ambulatory Visit (HOSPITAL_COMMUNITY)
Admission: RE | Admit: 2021-09-01 | Discharge: 2021-09-01 | Disposition: A | Discharge status: Home / Self Care | Payer: Medicare PPO | Source: Ambulatory Visit | Attending: Neurology | Admitting: Neurology

## 2021-09-01 ENCOUNTER — Other Ambulatory Visit: Payer: Self-pay

## 2021-09-01 DIAGNOSIS — R269 Unspecified abnormalities of gait and mobility: Secondary | ICD-10-CM | POA: Diagnosis not present

## 2021-09-01 DIAGNOSIS — R519 Headache, unspecified: Secondary | ICD-10-CM | POA: Diagnosis not present

## 2021-09-01 DIAGNOSIS — R42 Dizziness and giddiness: Secondary | ICD-10-CM | POA: Insufficient documentation

## 2021-09-02 ENCOUNTER — Ambulatory Visit: Payer: Medicare PPO | Admitting: Physical Therapy

## 2021-09-02 VITALS — BP 129/68 | HR 89

## 2021-09-02 DIAGNOSIS — R269 Unspecified abnormalities of gait and mobility: Secondary | ICD-10-CM | POA: Diagnosis not present

## 2021-09-02 DIAGNOSIS — R2681 Unsteadiness on feet: Secondary | ICD-10-CM

## 2021-09-02 DIAGNOSIS — R2689 Other abnormalities of gait and mobility: Secondary | ICD-10-CM | POA: Diagnosis not present

## 2021-09-02 DIAGNOSIS — R519 Headache, unspecified: Secondary | ICD-10-CM | POA: Diagnosis not present

## 2021-09-02 DIAGNOSIS — R42 Dizziness and giddiness: Secondary | ICD-10-CM

## 2021-09-03 NOTE — Therapy (Signed)
[facility] [address] [place], [place], [address] Phone: [phone]   Fax:  [phone]  Physical Therapy Treatment  Patient Details  Name: Sonya Gross MRN: [id number] Date of Birth: 1929-10-14 Referring Provider (PT): Dr. Levert Feinstein   Encounter Date: 09/02/2021    Past Medical History:  Diagnosis Date   Abnormal glucose    Bifascicular block 04/03/2019   Cataract    Dizziness    Dizziness    Hot flashes    HTN (hypertension) 04/03/2019   Hyperlipidemia    Multiple nodules of lung    Nonrheumatic aortic (valve) insufficiency 04/03/2019   Osteopenia    Palpitations    Thyroid disease    Vitamin D deficiency     Past Surgical History:  Procedure Laterality Date   ABDOMINAL HYSTERECTOMY     APPENDECTOMY  1975   CATARACT EXTRACTION, BILATERAL  2010   CHOLECYSTECTOMY  2003    Vitals:   09/02/21 1122  BP: 129/68  Pulse: 89     Subjective Assessment - 09/02/21 1108     Subjective Pt reports she continues to have some light-headedness all the time    Patient Stated Goals reduce the dizziness if possible    Currently in Pain? No/denies                               [facility] Adult PT Treatment/Exercise - 09/03/21 0001       Ambulation/Gait   Ambulation/Gait Yes    Ambulation/Gait Assistance 6: Modified independent (Device/Increase time)    Ambulation Distance (Feet) 100 Feet    Assistive device None    Gait Pattern Within Functional Limits    Ambulation Surface Level;Indoor      Standardized Balance Assessment   Standardized Balance Assessment Timed Up and Go Test;Dynamic Gait Index      Dynamic Gait Index   Level Surface Normal    Change in Gait Speed Normal    Gait with Horizontal Head Turns Mild Impairment    Gait with Vertical Head Turns Mild Impairment    Gait and Pivot Turn Normal    Step Over Obstacle Normal    Step Around Obstacles Normal    Steps Mild  Impairment    Total Score 21      Timed Up and Go Test   TUG Normal TUG    Normal TUG (seconds) 13.44                     PT Education - 09/03/21 1657     Education Details HEP - [employer]                 PT Long Term Goals - 08/18/21 1939       PT LONG TERM GOAL #1   Title Pt will improve DGI score by at least 3 points to demo improved balance with gait.    Time 4    Period Weeks    Status New    Target Date 09/26/21   delay 1 week starting due to no schedule availability     PT LONG TERM GOAL #2   Title Pt will report at least 25% improvement in dizziness for improved quality of life.    Time 4    Period Weeks    Status New    Target Date 09/26/21      PT LONG TERM GOAL #3  Title Independent in HEP for balance and vestibular exercises.    Time 4    Period Weeks    Status New    Target Date 09/26/21                    Patient will benefit from skilled therapeutic intervention in order to improve the following deficits and impairments:     Visit Diagnosis: No diagnosis found.     Problem List Patient Active Problem List   Diagnosis Date Noted   Nonintractable headache 08/04/2021   Gait abnormality 08/04/2021   Dizziness 08/02/2019   Numbness and tingling 06/20/2019   Flushing 06/20/2019   Nonrheumatic aortic (valve) insufficiency 04/03/2019   Bifascicular block 04/03/2019   HTN (hypertension) 04/03/2019   Hypothyroidism 02/26/2016   Hyperlipidemia 12/05/2015    Samik Balkcom, Donavan Burnet, PT 09/03/2021, 5:02 PM  [facility] [address] [place], [place], [address] Phone: [phone]   Fax:  [phone]  Name: ALANDRIA BUTKIEWICZ MRN: [id number] Date of Birth: [birth year]

## 2021-09-03 NOTE — Therapy (Signed)
[facility] [address] [place], [place], [address] Phone: [phone]   Fax:  [phone]  Physical Therapy Treatment  Patient Details  Name: Sonya Gross MRN: [id number] Date of Birth: 1930/05/02 Referring Provider (PT): Dr. Levert Feinstein   Encounter Date: 09/02/2021   PT End of Session - 09/03/21 1704     Visit Number 2    Number of Visits 5    Date for PT Re-Evaluation 10/03/21    Authorization Type 08-18-21 - 10-14-21    PT Start Time 1103    PT Stop Time 1145    PT Time Calculation (min) 42 min    Activity Tolerance Patient tolerated treatment well    Behavior During Therapy [facility] for tasks assessed/performed             Past Medical History:  Diagnosis Date   Abnormal glucose    Bifascicular block 04/03/2019   Cataract    Dizziness    Dizziness    Hot flashes    HTN (hypertension) 04/03/2019   Hyperlipidemia    Multiple nodules of lung    Nonrheumatic aortic (valve) insufficiency 04/03/2019   Osteopenia    Palpitations    Thyroid disease    Vitamin D deficiency     Past Surgical History:  Procedure Laterality Date   ABDOMINAL HYSTERECTOMY     APPENDECTOMY  1975   CATARACT EXTRACTION, BILATERAL  2010   CHOLECYSTECTOMY  2003    Vitals:   09/02/21 1122  BP: 129/68  Pulse: 89     Subjective Assessment - 09/02/21 1108     Subjective Pt reports she continues to have some light-headedness all the time    Patient Stated Goals reduce the dizziness if possible    Currently in Pain? No/denies                               [facility] Adult PT Treatment/Exercise - 09/03/21 0001       Ambulation/Gait   Ambulation/Gait Yes    Ambulation/Gait Assistance 6: Modified independent (Device/Increase time)    Ambulation Distance (Feet) 100 Feet    Assistive device None    Gait Pattern Within Functional Limits    Ambulation Surface Level;Indoor      Standardized Balance Assessment    Standardized Balance Assessment Timed Up and Go Test;Dynamic Gait Index      Dynamic Gait Index   Level Surface Normal    Change in Gait Speed Normal    Gait with Horizontal Head Turns Mild Impairment    Gait with Vertical Head Turns Mild Impairment    Gait and Pivot Turn Normal    Step Over Obstacle Normal    Step Around Obstacles Normal    Steps Mild Impairment    Total Score 21      Timed Up and Go Test   TUG Normal TUG    Normal TUG (seconds) 13.44              Pt instructed in balance HEP - [employer] [id number] - pt performed exercises with UE support prn for safety and for  Recovery of LOB       PT Education - 09/03/21 1703     Education Details HEP - [employer] [id number]    Person(s) Educated Patient    Methods Explanation;Demonstration;Handout    Comprehension Verbalized understanding;Returned demonstration  PT Long Term Goals - 08/18/21 1939       PT LONG TERM GOAL #1   Title Pt will improve DGI score by at least 3 points to demo improved balance with gait.    Time 4    Period Weeks    Status New    Target Date 09/26/21   delay 1 week starting due to no schedule availability     PT LONG TERM GOAL #2   Title Pt will report at least 25% improvement in dizziness for improved quality of life.    Time 4    Period Weeks    Status New    Target Date 09/26/21      PT LONG TERM GOAL #3   Title Independent in HEP for balance and vestibular exercises.    Time 4    Period Weeks    Status New    Target Date 09/26/21                   Plan - 09/03/21 1705     Clinical Impression Statement Pt's DGI score = 21/24 and TUG score 13.44 secs without device; neither of these scores is indicative of fall risk.  Remainder of session focused on establishing balance HEP.  Pt continues to c/o constant light-headedness which is of unknown etiology.  Cont with POC.    Personal Factors and Comorbidities Age;Past/Current Experience;Time  since onset of injury/illness/exacerbation;Comorbidity 2    Comorbidities Nonrheumatic aortic valave insufficiency, osteopenia, Vitamin D deficiency, HTN    Examination-Activity Limitations Locomotion Level;Transfers;Stairs;Squat    Examination-Participation Restrictions Cleaning;Laundry;Meal Prep;Shop    Stability/Clinical Decision Making Evolving/Moderate complexity    Rehab Potential Good    PT Frequency 1x / week    PT Duration 4 weeks    PT Treatment/Interventions ADLs/Self Care Home Management;Gait training;Stair training;Therapeutic activities;Therapeutic exercise;Balance training;Neuromuscular re-education;Patient/family education;Vestibular    PT Next Visit Plan check HEP for balance    Consulted and Agree with Plan of Care Patient             Patient will benefit from skilled therapeutic intervention in order to improve the following deficits and impairments:  Difficulty walking, Decreased balance, Dizziness  Visit Diagnosis: Dizziness and giddiness  Other abnormalities of gait and mobility  Unsteadiness on feet     Problem List Patient Active Problem List   Diagnosis Date Noted   Nonintractable headache 08/04/2021   Gait abnormality 08/04/2021   Dizziness 08/02/2019   Numbness and tingling 06/20/2019   Flushing 06/20/2019   Nonrheumatic aortic (valve) insufficiency 04/03/2019   Bifascicular block 04/03/2019   HTN (hypertension) 04/03/2019   Hypothyroidism 02/26/2016   Hyperlipidemia 12/05/2015    Kary Kos, PT 09/03/2021, 5:08 PM  [facility] [address] [place], [place], [address] Phone: [phone]   Fax:  [phone]  Name: Sonya Gross MRN: [id number] Date of Birth: April 22, 1930

## 2021-09-09 ENCOUNTER — Ambulatory Visit: Payer: Medicare PPO | Admitting: Physical Therapy

## 2021-09-16 ENCOUNTER — Encounter: Payer: Medicare PPO | Admitting: Physical Therapy

## 2021-09-20 DIAGNOSIS — U071 COVID-19: Secondary | ICD-10-CM | POA: Diagnosis not present

## 2021-09-23 ENCOUNTER — Encounter: Payer: Medicare PPO | Admitting: Physical Therapy

## 2021-09-23 ENCOUNTER — Other Ambulatory Visit: Payer: Self-pay

## 2021-09-23 ENCOUNTER — Ambulatory Visit: Payer: Medicare PPO | Attending: Family Medicine | Admitting: Physical Therapy

## 2021-09-23 ENCOUNTER — Encounter: Payer: Self-pay | Admitting: Physical Therapy

## 2021-09-23 DIAGNOSIS — R2689 Other abnormalities of gait and mobility: Secondary | ICD-10-CM | POA: Insufficient documentation

## 2021-09-23 DIAGNOSIS — R42 Dizziness and giddiness: Secondary | ICD-10-CM | POA: Diagnosis not present

## 2021-09-23 DIAGNOSIS — R2681 Unsteadiness on feet: Secondary | ICD-10-CM | POA: Insufficient documentation

## 2021-09-24 NOTE — Therapy (Signed)
[facility] [address] [place], [place], [address] Phone: [phone]   Fax:  [phone]  Physical Therapy Treatment & Discharge Summary  Patient Details  Name: Sonya Gross MRN: [id number] Date of Birth: 12-Apr-1930 Referring Provider (PT): Dr. Marcial Pacas   Encounter Date: 09/23/2021   PT End of Session - 09/24/21 1241     Visit Number 3    Number of Visits 5    Date for PT Re-Evaluation 10/03/21    Authorization Type 08-18-21 - 10-14-21    PT Start Time 0933    PT Stop Time 1015    PT Time Calculation (min) 42 min    Activity Tolerance Patient tolerated treatment well    Behavior During Therapy [facility] for tasks assessed/performed             Past Medical History:  Diagnosis Date   Abnormal glucose    Bifascicular block 04/03/2019   Cataract    Dizziness    Dizziness    Hot flashes    HTN (hypertension) 04/03/2019   Hyperlipidemia    Multiple nodules of lung    Nonrheumatic aortic (valve) insufficiency 04/03/2019   Osteopenia    Palpitations    Thyroid disease    Vitamin D deficiency     Past Surgical History:  Procedure Laterality Date   ABDOMINAL HYSTERECTOMY     APPENDECTOMY  1975   CATARACT EXTRACTION, BILATERAL  2010   CHOLECYSTECTOMY  2003    There were no vitals filed for this visit.   Subjective Assessment - 09/23/21 0932     Subjective Pt reports she got sick on Christmas Day - hasn't really had a chance to do the exercises; states dizziness has been about the same; reports no dizziness at this time    Patient Stated Goals reduce the dizziness if possible    Currently in Pain? No/denies                               [facility] Adult PT Treatment/Exercise - 09/24/21 0001       Ambulation/Gait   Ambulation/Gait Yes    Ambulation/Gait Assistance 6: Modified independent (Device/Increase time)    Ambulation Distance (Feet) 100 Feet    Assistive device None    Gait  Pattern Within Functional Limits      Dynamic Gait Index   Level Surface Normal    Change in Gait Speed Normal    Gait with Horizontal Head Turns Mild Impairment    Gait with Vertical Head Turns Normal    Gait and Pivot Turn Normal    Step Over Obstacle Normal    Step Around Obstacles Normal    Steps Mild Impairment    Total Score 22      Timed Up and Go Test   TUG Normal TUG    Normal TUG (seconds) 11.5                 Balance Exercises - 09/24/21 0001       Balance Exercises: Standing   Other Standing Exercises Reviewed HEP - pt performed balance HEP at counter with UE support prn - standing forward, back and side kicks; partial tandem stance; SLS; marching in place, marching forward/backward along counter 2 reps; sit to stand from mat table without UE support 3 reps  PT Long Term Goals - 09/23/21 5784       PT LONG TERM GOAL #1   Title Pt will improve DGI score by at least 3 points to demo improved balance with gait.    Baseline score 22/24 on 09-23-21    Time 4    Period Weeks    Status Partially Met    Target Date 09/26/21   delay 1 week starting due to no schedule availability     PT LONG TERM GOAL #2   Title Pt will report at least 25% improvement in dizziness for improved quality of life.    Baseline pt reports no change in dizziness - reports she has same constant feeling of fullness in Rt ear; denies true spinning vertigo    Time 4    Period Weeks    Status Not Met    Target Date 09/26/21      PT LONG TERM GOAL #3   Title Independent in HEP for balance and vestibular exercises.    Time 4    Period Weeks    Status Achieved    Target Date 09/26/21                   Plan - 09/24/21 1242     Clinical Impression Statement Pt has partially met LTG #1, LTG #2 not met as pt reports no change in "dizziness" and LTG #3 met as pt has been instructed in a HEP for balance.  Pt continues to report fullness/pressure in  Rt ear - seems to equate this feeling with dizziness as she denies room spinning vertigo.  Further diagnostic workup from ENT may be beneficial in determining dysfunction of Rt ear sensation of fullness and pressure.  Balance and gait are WNL's - no further PT is warranted at this time.    Personal Factors and Comorbidities Age;Past/Current Experience;Time since onset of injury/illness/exacerbation;Comorbidity 2    Comorbidities Nonrheumatic aortic valave insufficiency, osteopenia, Vitamin D deficiency, HTN    Examination-Activity Limitations Locomotion Level;Transfers;Stairs;Squat    Examination-Participation Restrictions Cleaning;Laundry;Meal Prep;Shop    Stability/Clinical Decision Making Evolving/Moderate complexity    Rehab Potential Good    PT Frequency 1x / week    PT Duration 4 weeks    PT Treatment/Interventions ADLs/Self Care Home Management;Gait training;Stair training;Therapeutic activities;Therapeutic exercise;Balance training;Neuromuscular re-education;Patient/family education;Vestibular    PT Next Visit Plan D/C on 09-23-21    Consulted and Agree with Plan of Care Patient             Patient will benefit from skilled therapeutic intervention in order to improve the following deficits and impairments:  Difficulty walking, Decreased balance, Dizziness  Visit Diagnosis: Dizziness and giddiness  Other abnormalities of gait and mobility  Unsteadiness on feet     Problem List Patient Active Problem List   Diagnosis Date Noted   Nonintractable headache 08/04/2021   Gait abnormality 08/04/2021   Dizziness 08/02/2019   Numbness and tingling 06/20/2019   Flushing 06/20/2019   Nonrheumatic aortic (valve) insufficiency 04/03/2019   Bifascicular block 04/03/2019   HTN (hypertension) 04/03/2019   Hypothyroidism 02/26/2016   Hyperlipidemia 12/05/2015    PHYSICAL THERAPY DISCHARGE SUMMARY  Visits from Start of Care:  3  Current functional level related to goals /  functional outcomes: See above for progress towards goals   Remaining deficits: Pt continues to c/o fullness/pressure in Rt ear - appears to equate this sensation with dizziness   Education / Equipment: Pt has been instructed in HEP for balance  exs.   Patient agrees to discharge. Patient goals were partially met. Patient is being discharged due to meeting the stated rehab goals.    Alda Lea, PT 09/24/2021, 12:45 PM  [facility] [address] [place], [place], [address] Phone: [phone]   Fax:  [phone]  Name: KAREMA TOCCI MRN: [id number] Date of Birth: 11-29-1929

## 2021-10-02 DIAGNOSIS — H43813 Vitreous degeneration, bilateral: Secondary | ICD-10-CM | POA: Diagnosis not present

## 2021-10-02 DIAGNOSIS — H401131 Primary open-angle glaucoma, bilateral, mild stage: Secondary | ICD-10-CM | POA: Diagnosis not present

## 2021-11-11 ENCOUNTER — Ambulatory Visit: Payer: Medicare PPO | Admitting: Adult Health

## 2021-11-11 ENCOUNTER — Telehealth: Payer: Self-pay | Admitting: Neurology

## 2021-11-11 ENCOUNTER — Encounter: Payer: Self-pay | Admitting: Adult Health

## 2021-11-11 VITALS — BP 135/78 | HR 80 | Ht 66.0 in | Wt 134.0 lb

## 2021-11-11 DIAGNOSIS — R42 Dizziness and giddiness: Secondary | ICD-10-CM

## 2021-11-11 DIAGNOSIS — R269 Unspecified abnormalities of gait and mobility: Secondary | ICD-10-CM | POA: Diagnosis not present

## 2021-11-11 DIAGNOSIS — R519 Headache, unspecified: Secondary | ICD-10-CM

## 2021-11-11 NOTE — Telephone Encounter (Signed)
Referral sent to [facility] [phone]. °

## 2021-11-11 NOTE — Patient Instructions (Signed)
Your Plan:  Referral will be placed to ENT  I will further discuss need of repeat imaging of your brain with Dr. Terrace Arabia - we will call you if she would like to have this done       Thank you for coming to see [place] at [facility]. I hope we have been able to provide you high quality care today.  You may receive a patient satisfaction survey over the next few weeks. We would appreciate your feedback and comments so that we may continue to improve ourselves and the health of our patients.

## 2021-11-11 NOTE — Progress Notes (Signed)
Chief Complaint  Patient presents with   Follow-up    RM 3 alone  Pt is well, headaches and imbalance is all about the same as last visit.       ASSESSMENT AND PLAN  Sonya Gross is a 88 y.o. female   Chronic headache, worsening  Occurs 2x per month, non debilitating. Continue use of Tylenol if needed  No indication for prophylactic or emergent therapy  Will further discuss with Dr. Krista Blue if repeat MR brain indicated due to worsening headaches   Gait abnormality Chronic dizziness  Referral to ENT due to right ear symptoms  Completed PT x3 sessions Will further discuss with Dr. Krista Blue if repeat MR brain indicated with cuts through [facility] MR cervical degenerative disease with C2-3 moderate spinal stenosis and variable degree of foraminal narrowing but no spinal cord signal abnormality       DIAGNOSTIC DATA (LABS, IMAGING, TESTING) - I reviewed patient records, labs, notes, testing and imaging myself where available.  MR CERVICAL 08/24/2021 IMPRESSION: This MRI of the cervical spine without contrast shows the following: 1.   The spinal cord appears normal. 2.   At C2-C3, there is moderate spinal stenosis due to degenerative changes including right paramedian disc protrusion.  There is no nerve root compression. 3.   At C3-C4, there is mild spinal stenosis due to degenerative changes and moderately severe left foraminal narrowing with potential for left C4 nerve root compression. 4.   At C5-C6, there is mild spinal stenosis due to degenerative changes and moderately severe right foraminal narrowing with potential for right C6 nerve root compression. 5.   Milder degenerative changes at C4-C5 and C6-C7 do not lead to spinal stenosis or nerve root compression.  MR BRAIN WO CONTRAST 07/07/2019 IMPRESSION: No acute intracranial abnormality Age-appropriate atrophy. Moderate chronic microvascular ischemic changes  Laboratory evaluation 08/04/2021: CRP 1, sed rate 6  Laboratory  evaluation in September 2020; normal free T4, TSH was mildly elevated at 5.64, normal CMP creatinine of 0.71, A1c 6.1, vitamin D 57.3, LDL 143   MEDICAL HISTORY:   Sonya Gross is a very pleasant 88 year old female, seen in request by her primary care physician Dr. Caren Macadam, for evaluation of right-sided headache, dizziness,   Update 11/11/2021 JM: Returns for 36-month follow-up visit unaccompanied.  Continued intermittent headaches approx 2 times per month only lasting a few minutes usually on left side of head but at times can be right side. Denies any other associated symptoms such as visual changes, photophobia or phonophobia.  Headaches are not debilitating and has not needed to use OTC pain reliever.  Continued intermittent dizziness and imbalance. Completed 3 sessions of PT with reported no change in dizziness and PT concern of pressure/fullness sensation in right ear - questioned ENT evaluation. She reports this is still present although stable compared to prior visit. Per PT, balance and gait WNLs and no further PT warranted at that time.  No further concerns at this time     Consult visit 08/04/2021 Dr. Krista Blue: I reviewed and summarized the referring note.  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 35, fell down steps, lost consciousness for extended period of time, 2 weeks per report,  Since then, she had intermittent headaches, getting worse since 2020, she felt something hollow at the right temporoparietal region, occasionally painful, couple times each week, relieved by resting, over-the-counter medication  She denies  jaw claudication, no drooling swallowing difficulty, no diffuse body achy pain,  Since 2020, she also complains of intermittent dizziness, it is not vertigo, lightheadedness when getting up from prolonged sitting, also complains of  unsteadiness, denies bowel and bladder incontinence  Blood pressure today does not demonstrate orthostatic changes, sitting down 149/79, heart rate of 68; standing up 159/84 heart rate of 73; standing up for 1 minute 158/82 heart rate of 73  Personally reviewed MRI of the brain October 2020, no acute intracranial abnormality, atrophy, microsmall vessel disease     PHYSICAL EXAM:   Vitals:   11/11/21 1119  BP: 135/78  Pulse: 80  Weight: 134 lb (60.8 kg)  Height: 5\' 6"  (1.676 m)    Not recorded     Body mass index is 21.63 kg/m.  PHYSICAL EXAMNIATION:  Gen: NAD, very pleasant elderly African-American female, conversant, well nourised, well groomed                     Cardiovascular: Regular rate rhythm, no peripheral edema, warm, nontender. Eyes: Conjunctivae clear without exudates or hemorrhage Neck: Supple, no carotid bruits. Pulmonary: Clear to auscultation bilaterally   NEUROLOGICAL EXAM:  MENTAL STATUS: Speech    Speech is normal; fluent and spontaneous with normal comprehension.  Cognition:     Orientation to time, place and person     Normal recent and remote memory     Normal Attention span and concentration     Normal Language, naming, repeating,spontaneous speech     Fund of knowledge   CRANIAL NERVES: CN II: Visual fields are full to confrontation. Pupils are round equal and briskly reactive to light. CN III, IV, VI: extraocular movement are normal. No ptosis. CN V: Facial sensation is intact to light touch CN VII: Face is symmetric with normal eye closure  CN VIII: Hearing is normal to causal conversation. CN IX, X: Phonation is normal. CN XI: Head turning and shoulder shrug are intact  MOTOR: There is no pronator drift of out-stretched arms. Muscle bulk and tone are normal. Muscle strength is normal.  REFLEXES: Reflexes are 2+ and symmetric at the biceps, triceps, 3/3 knees, and ankles. Plantar responses are flexor.  SENSORY: Intact to light  touch, pinprick and vibratory sensation are intact in fingers and toes.  COORDINATION: There is no trunk or limb dysmetria noted.  GAIT/STANCE: Need push-up to get up from seated position, cautious, stiff, wide-based with mild imbalance and swaying. No AD.  Unable to perform tandem walk and heel toe   REVIEW OF SYSTEMS:  Full 14 system review of systems performed and notable only for as above All other review of systems were negative.   ALLERGIES: Allergies  Allergen Reactions   Shellfish Allergy Itching    Pt's daughter states she had to go to the ER.   Penicillins Swelling and Rash    Did it involve swelling of the face/tongue/throat, SOB, or low BP? No Did it involve sudden or severe rash/hives, skin peeling, or any reaction on the inside of your mouth or nose? Yes Did you need to seek medical attention at a hospital or doctor's office? No When did it last happen?     Over 10 years ago  If all above answers are NO, may proceed with cephalosporin use.     HOME MEDICATIONS: Current Outpatient Medications  Medication Sig Dispense Refill   alendronate (FOSAMAX) 70 MG tablet Take 1 tablet by mouth daily.     B Complex Vitamins (VITAMIN  B COMPLEX) TABS Take 1 tablet by mouth See admin instructions.     bimatoprost (LUMIGAN) 0.01 % SOLN Place 1 drop into the right eye at bedtime.     Cholecalciferol (VITAMIN D3 PO) Take 1 tablet by mouth daily.     clobetasol cream (TEMOVATE) [id number] % Apply 1 application topically as needed.     gabapentin (NEURONTIN) 100 MG capsule Take 1 capsule by mouth daily.     hydrALAZINE (APRESOLINE) 25 MG tablet Take 1 tablet (25 mg total) by mouth 3 (three) times daily. 270 tablet 3   levothyroxine (SYNTHROID) 25 MCG tablet Take 25 mcg by mouth daily before breakfast.      meclizine (ANTIVERT) 25 MG tablet Take 1 tablet by mouth daily.     Multiple Vitamins-Minerals (PRESERVISION/LUTEIN) CAPS Take 1 capsule by mouth daily.     rosuvastatin (CRESTOR) 20  MG tablet Take 1 tablet by mouth daily.     No current facility-administered medications for this visit.    PAST MEDICAL HISTORY: Past Medical History:  Diagnosis Date   Abnormal glucose    Bifascicular block 04/03/2019   Cataract    Dizziness    Dizziness    Hot flashes    HTN (hypertension) 04/03/2019   Hyperlipidemia    Multiple nodules of lung    Nonrheumatic aortic (valve) insufficiency 04/03/2019   Osteopenia    Palpitations    Thyroid disease    Vitamin D deficiency     PAST SURGICAL HISTORY: Past Surgical History:  Procedure Laterality Date   ABDOMINAL HYSTERECTOMY     APPENDECTOMY  1975   CATARACT EXTRACTION, BILATERAL  2010   CHOLECYSTECTOMY  2003    FAMILY HISTORY: Family History  Problem Relation Age of Onset   Uterine cancer Mother        died at age 63   Healthy Father        died at age 57   Mental illness Sister     SOCIAL HISTORY: Social History   Socioeconomic History   Marital status: Widowed    Spouse name: Not on file   Number of children: 2   Years of education: college - masters in [employer] education level: Master's degree (e.g., MA, MS, MEng, MEd, MSW, MBA)  Occupational History   Occupation: Retired   Tobacco Use   Smoking status: Never   Smokeless tobacco: Never  Vaping Use   Vaping Use: Never used  Substance and Sexual Activity   Alcohol use: Never    Alcohol/week: 0.0 standard drinks   Drug use: Never   Sexual activity: Not on file  Other Topics Concern   Not on file  Social History Narrative   Lives alone.   One cup caffeine use.   Right-handed.   Social Determinants of Health   Financial Resource Strain: Not on file  Food Insecurity: Not on file  Transportation Needs: Not on file  Physical Activity: Not on file  Stress: Not on file  Social Connections: Not on file  Intimate Partner Violence: Not on file        I spent 26 minutes of face-to-face and non-face-to-face time with patient.  This  included previsit chart review, lab review, study review, electronic health record documentation, patient discussion regarding above diagnoses and pertinent education and further treatment plan and answered all other questions to patient satisfaction  Frann Rider, [facility]  [facility] [address] [place], [place] [address]  Phone [phone] Fax [phone] Note:  This document was prepared with digital dictation and possible smart phrase technology. Any transcriptional errors that result from this process are unintentional.   CC:  [facility] provider: Dr. Krista Blue  PCP: Caren Macadam, MD [place],  [place] [id number]  Caren Macadam, MD

## 2021-11-12 NOTE — Progress Notes (Signed)
Chart reviewed,   Patient complains lightheadedness, imbalance sensation when getting up from sitting position, it is not vertigo per history  In addition, she had MRI of the brain in 2020 for similar complaints, no acute abnormality found.  I do not think repeat MRI of the brain, with without contrast will internal auditory canal would change to treatment plan for the patient, make sure if she is following up with her ENT physician.

## 2021-12-12 DIAGNOSIS — R519 Headache, unspecified: Secondary | ICD-10-CM | POA: Diagnosis not present

## 2021-12-12 DIAGNOSIS — I10 Essential (primary) hypertension: Secondary | ICD-10-CM | POA: Diagnosis not present

## 2021-12-12 DIAGNOSIS — M8589 Other specified disorders of bone density and structure, multiple sites: Secondary | ICD-10-CM | POA: Diagnosis not present

## 2021-12-12 DIAGNOSIS — E039 Hypothyroidism, unspecified: Secondary | ICD-10-CM | POA: Diagnosis not present

## 2021-12-12 DIAGNOSIS — I351 Nonrheumatic aortic (valve) insufficiency: Secondary | ICD-10-CM | POA: Diagnosis not present

## 2021-12-12 DIAGNOSIS — E7800 Pure hypercholesterolemia, unspecified: Secondary | ICD-10-CM | POA: Diagnosis not present

## 2021-12-12 DIAGNOSIS — Z0000 Encounter for general adult medical examination without abnormal findings: Secondary | ICD-10-CM | POA: Diagnosis not present

## 2021-12-12 DIAGNOSIS — Z23 Encounter for immunization: Secondary | ICD-10-CM | POA: Diagnosis not present

## 2021-12-15 DIAGNOSIS — H409 Unspecified glaucoma: Secondary | ICD-10-CM | POA: Diagnosis not present

## 2021-12-15 DIAGNOSIS — I10 Essential (primary) hypertension: Secondary | ICD-10-CM | POA: Diagnosis not present

## 2021-12-15 DIAGNOSIS — M810 Age-related osteoporosis without current pathological fracture: Secondary | ICD-10-CM | POA: Diagnosis not present

## 2021-12-15 DIAGNOSIS — R42 Dizziness and giddiness: Secondary | ICD-10-CM | POA: Diagnosis not present

## 2021-12-15 DIAGNOSIS — Z7983 Long term (current) use of bisphosphonates: Secondary | ICD-10-CM | POA: Diagnosis not present

## 2021-12-15 DIAGNOSIS — Z803 Family history of malignant neoplasm of breast: Secondary | ICD-10-CM | POA: Diagnosis not present

## 2021-12-15 DIAGNOSIS — E785 Hyperlipidemia, unspecified: Secondary | ICD-10-CM | POA: Diagnosis not present

## 2022-01-12 ENCOUNTER — Ambulatory Visit: Payer: Medicare PPO | Admitting: Cardiology

## 2022-01-12 ENCOUNTER — Encounter: Payer: Self-pay | Admitting: Cardiology

## 2022-01-12 VITALS — BP 114/69 | HR 83 | Temp 98.3°F | Resp 17 | Ht 66.0 in | Wt 132.4 lb

## 2022-01-12 DIAGNOSIS — I10 Essential (primary) hypertension: Secondary | ICD-10-CM

## 2022-01-12 DIAGNOSIS — I452 Bifascicular block: Secondary | ICD-10-CM

## 2022-01-12 DIAGNOSIS — I351 Nonrheumatic aortic (valve) insufficiency: Secondary | ICD-10-CM | POA: Diagnosis not present

## 2022-01-12 NOTE — Progress Notes (Signed)
? ?Primary Physician/Referring:  Sonya Macadam, MD ? ?Patient ID: Sonya Gross, female    DOB: 1930/01/29, 88 y.o.   MRN: [id number] ? ?Chief Complaint  ?Patient presents with  ?? Hypertension  ?? Follow-up  ?  1 year  ? ?HPI:   ? ?Sonya Gross  is a 88 y.o. pleasant African-American female with mild to moderate aortic regurgitation, bifascicular block on the EKG, hypertension, mixed hyperlipidemia, presents here for follow-up of dizziness and palpitations.  Symptoms of palpitations are remained stable, chronic dizziness unrelated to her position.   ? ?She is presently doing well otherwise remains asymptomatic except for occasional episodes of dizziness and occasional brief palpitations. ? ?Past Medical History:  ?Diagnosis Date  ?? Abnormal glucose   ?? Bifascicular block 04/03/2019  ?? Cataract   ?? Dizziness   ?? Dizziness   ?? Hot flashes   ?? HTN (hypertension) 04/03/2019  ?? Hyperlipidemia   ?? Multiple nodules of lung   ?? Nonrheumatic aortic (valve) insufficiency 04/03/2019  ?? Osteopenia   ?? Palpitations   ?? Thyroid disease   ?? Vitamin D deficiency   ? ?Past Surgical History:  ?Procedure Laterality Date  ?? ABDOMINAL HYSTERECTOMY    ?? APPENDECTOMY  1975  ?? CATARACT EXTRACTION, BILATERAL  2010  ?? CHOLECYSTECTOMY  2003  ? ?Family History  ?Problem Relation Age of Onset  ?? Uterine cancer Mother   ?     died at age 41  ?? Healthy Father   ?     died at age 65  ?? Mental illness Sister   ?  ?Social History  ? ?Tobacco Use  ?? Smoking status: Never  ?? Smokeless tobacco: Never  ?Substance Use Topics  ?? Alcohol use: Never  ?  Alcohol/week: 0.0 standard drinks  ? ?ROS  ?Review of Systems  ?Cardiovascular:  Positive for palpitations. Negative for chest pain, dyspnea on exertion and leg swelling.  ?Objective  ?Blood pressure 114/69.  ? ?  01/12/2022  ? 10:35 AM 11/11/2021  ? 11:19 AM 09/02/2021  ? 11:22 AM  ?Vitals with BMI  ?Height  $Remove'5\' 6"'[id number]$    ?Weight  134 lbs   ?BMI  21.64   ?Systolic 903 833 383   ?Diastolic 69 78 68  ?Pulse  80 89  ?  ? Physical Exam ?Neck:  ?   Vascular: No JVD.  ?Cardiovascular:  ?   Rate and Rhythm: Normal rate and regular rhythm.  ?   Pulses: Intact distal pulses.  ?   Heart sounds: Murmur heard.  ?Decrescendo early diastolic murmur is present with a grade of 2/4 at the upper right sternal border.  ?  No gallop.  ?Pulmonary:  ?   Effort: Pulmonary effort is normal.  ?   Breath sounds: Normal breath sounds.  ?Abdominal:  ?   General: Bowel sounds are normal.  ?   Palpations: Abdomen is soft.  ?Musculoskeletal:  ?   Right lower leg: No edema.  ?   Left lower leg: No edema.  ? ?Laboratory examination:  ?External labs: ? ?Labs 12/12/2021: ? ?BUN 16, creatinine 0.63, EGFR 77 mL, potassium 4.4.  LFTs normal. ? ?Vitamin D 43.2. ? ?Total cholesterol 148, triglycerides 64, HDL 58, LDL 77. ? ?A1c 6.0%. ?Medications and allergies  ? ?Allergies  ?Allergen Reactions  ?? Shellfish Allergy Itching  ?  Pt's daughter states she had to go to the ER.  ?? Penicillins Swelling and Rash  ?  Did it involve swelling of the face/tongue/throat,  SOB, or low BP? No ?Did it involve sudden or severe rash/hives, skin peeling, or any reaction on the inside of your mouth or nose? Yes ?Did you need to seek medical attention at a hospital or doctor's office? No ?When did it last happen?     Over 10 years ago  ?If all above answers are ?NO?, may proceed with cephalosporin use. ?  ?  ?Current Outpatient Medications:  ??  B Complex Vitamins (VITAMIN B COMPLEX) TABS, Take 1 tablet by mouth See admin instructions., Disp: , Rfl:  ??  bimatoprost (LUMIGAN) 0.01 % SOLN, Place 1 drop into the right eye at bedtime., Disp: , Rfl:  ??  Cholecalciferol (VITAMIN D3 PO), Take 1 tablet by mouth daily., Disp: , Rfl:  ??  gabapentin (NEURONTIN) 100 MG capsule, Take 1 capsule by mouth daily., Disp: , Rfl:  ??  hydrALAZINE (APRESOLINE) 25 MG tablet, Take 1 tablet (25 mg total) by mouth 3 (three) times daily., Disp: 270 tablet, Rfl: 3 ??   levothyroxine (SYNTHROID) 25 MCG tablet, Take 25 mcg by mouth daily before breakfast. , Disp: , Rfl:  ??  Multiple Vitamins-Minerals (PRESERVISION/LUTEIN) CAPS, Take 1 capsule by mouth daily., Disp: , Rfl:  ??  rosuvastatin (CRESTOR) 20 MG tablet, Take 1 tablet by mouth daily., Disp: , Rfl:  ??  alendronate (FOSAMAX) 70 MG tablet, Take 1 tablet by mouth daily., Disp: , Rfl:  ??  clobetasol cream (TEMOVATE) 5.79 %, Apply 1 application topically as needed., Disp: , Rfl:  ??  meclizine (ANTIVERT) 25 MG tablet, Take 1 tablet by mouth daily., Disp: , Rfl:   ?  ? ?Radiology:  ? ?MRI brain 07/07/2019: ?No acute intracranial abnormality ?Age-appropriate atrophy. Moderate chronic microvascular ischemic changes ? ?Cardiac Studies:  ?   ?Treadmill stress test  [07/29/2015]: Indication:RBBB, Abnormal ECG ?The patient exercised according to Bruce Protocol, Total time recorded 3:01 min achieving max heart rate of 132 which was 96% of MPHR for age and 4.64 METS of work. Normal BP response. Resting ECG showing a NSR with a RBBB. There was no ST-T changes of ischemia with exercise stress test. Stress terminated due to fatigue and THR >85% MPHR met. Rare PVC. Markedly reduced exercise capacity and aerobic threshold. ? ?Event monitor 04/04/2019 - 04/17/2019.  ?Baseline sample showed Sinus Rhythm w/Artifact with a heart rate of 84.2 bpm. There were 0 critical, 0 serious, and 39 stable events that occurred.  Symptomatic transmissions of dyspnea and lightheadedness revealed sinus rhythm/sinus tachycardia.  No other significant arrhythmias noted. ? ?Echocardiogram 05/12/2019: ?Left ventricle cavity is normal in size. Normal left ventricular wall thickness. Normal LV systolic function with EF 61%. Normal global wall motion. Doppler evidence of grade I (impaired) diastolic dysfunction, normal LAP.  ?Trileaflet aortic valve. Mild aortic valve leaflet calcification. Moderate (Grade II) aortic regurgitation. ?Moderate (Grade III) mitral  regurgitation. ?Mild tricuspid regurgitation.  ?No evidence of pulmonary hypertension. ?Compared to previous study in 2017, mitral and tricuspid regurgitation increased in severity.  ? ?Carotid artery duplex 09/01/2021: ?Right Carotid: Velocities in the right ICA are consistent with a 1-39% stenosis.  ?Left Carotid: Velocities in the left ICA are consistent with a 1-39% stenosis.  ?Vertebrals:  Bilateral vertebral arteries demonstrate antegrade flow. ?Subclavians: Normal flow hemodynamics were seen in bilateral subclavian arteries ? ?EKG: ?EKG 01/12/2022: Normal sinus rhythm at rate of 81 bpm, left atrial enlargement, left axis deviation, left anterior fascicular block.  Right bundle branch block.  Bifascicular block.  No significant change from 11/14/2020.  ? ?  Assessment  ? ?  ICD-10-CM   ?1. Essential hypertension  I10 EKG 12-Lead  ?  ?2. Moderate aortic regurgitation  I35.1   ?  ?3. Bifascicular block  I45.2   ?  ?  ?No orders of the defined types were placed in this encounter. ?  ?There are no discontinued medications. ? ?Orders Placed This Encounter  ?Procedures  ?? EKG 12-Lead  ? ?  ?Recommendations:  ? ?BRIHANA QUICKEL  is a 88 y.o. pleasant African-American female with mild to moderate aortic regurgitation, bifascicular block on the EKG, hypertension, mixed hyperlipidemia, presents here for follow-up of dizziness and palpitations.  Symptoms of palpitations are remained stable, chronic dizziness unrelated to her position.  Event monitor on 04/04/2019 was unrevealing and echocardiogram on 05/12/2019 revealed normal LVEF and moderate aortic regurgitation and mitral regurgitation without pulmonary hypertension. ? ?Today she is not orthostatic as well.  She is on minimal dose of hydralazine with excellent control of blood pressure.  Lipids are also well controlled.  Her examination has not changed since last office visit.  As its been 3 years since prior echocardiogram, will repeat echocardiogram to follow-up on the  aortic regurgitation.  Otherwise I have discussed with her regarding near syncope, fall precautions.  I will see her back on an annual basis.  External labs reviewed. ? ?She was also evaluated by neurology, carotid arter

## 2022-01-16 DIAGNOSIS — H9201 Otalgia, right ear: Secondary | ICD-10-CM | POA: Diagnosis not present

## 2022-01-16 DIAGNOSIS — H903 Sensorineural hearing loss, bilateral: Secondary | ICD-10-CM | POA: Diagnosis not present

## 2022-01-16 DIAGNOSIS — R42 Dizziness and giddiness: Secondary | ICD-10-CM | POA: Diagnosis not present

## 2022-01-19 ENCOUNTER — Ambulatory Visit: Payer: Medicare PPO

## 2022-01-19 DIAGNOSIS — I351 Nonrheumatic aortic (valve) insufficiency: Secondary | ICD-10-CM

## 2022-02-03 DIAGNOSIS — H401131 Primary open-angle glaucoma, bilateral, mild stage: Secondary | ICD-10-CM | POA: Diagnosis not present

## 2022-02-03 DIAGNOSIS — H43813 Vitreous degeneration, bilateral: Secondary | ICD-10-CM | POA: Diagnosis not present

## 2022-02-12 DIAGNOSIS — R42 Dizziness and giddiness: Secondary | ICD-10-CM | POA: Diagnosis not present

## 2022-02-20 DIAGNOSIS — H8101 Meniere's disease, right ear: Secondary | ICD-10-CM | POA: Diagnosis not present

## 2022-02-20 DIAGNOSIS — H903 Sensorineural hearing loss, bilateral: Secondary | ICD-10-CM | POA: Diagnosis not present

## 2022-03-02 DIAGNOSIS — Z1231 Encounter for screening mammogram for malignant neoplasm of breast: Secondary | ICD-10-CM | POA: Diagnosis not present

## 2022-04-17 ENCOUNTER — Other Ambulatory Visit: Payer: Self-pay | Admitting: Cardiology

## 2022-04-17 DIAGNOSIS — I351 Nonrheumatic aortic (valve) insufficiency: Secondary | ICD-10-CM

## 2022-04-17 DIAGNOSIS — I10 Essential (primary) hypertension: Secondary | ICD-10-CM

## 2022-05-05 ENCOUNTER — Other Ambulatory Visit: Payer: Self-pay

## 2022-05-05 DIAGNOSIS — I351 Nonrheumatic aortic (valve) insufficiency: Secondary | ICD-10-CM

## 2022-05-05 DIAGNOSIS — I10 Essential (primary) hypertension: Secondary | ICD-10-CM

## 2022-05-06 DIAGNOSIS — H43813 Vitreous degeneration, bilateral: Secondary | ICD-10-CM | POA: Diagnosis not present

## 2022-05-06 DIAGNOSIS — H401131 Primary open-angle glaucoma, bilateral, mild stage: Secondary | ICD-10-CM | POA: Diagnosis not present

## 2022-05-22 DIAGNOSIS — H8101 Meniere's disease, right ear: Secondary | ICD-10-CM | POA: Diagnosis not present

## 2022-05-22 DIAGNOSIS — H903 Sensorineural hearing loss, bilateral: Secondary | ICD-10-CM | POA: Diagnosis not present

## 2022-06-17 DIAGNOSIS — R634 Abnormal weight loss: Secondary | ICD-10-CM | POA: Diagnosis not present

## 2022-06-17 DIAGNOSIS — R7303 Prediabetes: Secondary | ICD-10-CM | POA: Diagnosis not present

## 2022-06-17 DIAGNOSIS — E039 Hypothyroidism, unspecified: Secondary | ICD-10-CM | POA: Diagnosis not present

## 2022-06-17 DIAGNOSIS — I10 Essential (primary) hypertension: Secondary | ICD-10-CM | POA: Diagnosis not present

## 2022-06-17 DIAGNOSIS — E7800 Pure hypercholesterolemia, unspecified: Secondary | ICD-10-CM | POA: Diagnosis not present

## 2022-06-17 DIAGNOSIS — Z23 Encounter for immunization: Secondary | ICD-10-CM | POA: Diagnosis not present

## 2022-07-08 DIAGNOSIS — H43813 Vitreous degeneration, bilateral: Secondary | ICD-10-CM | POA: Diagnosis not present

## 2022-07-08 DIAGNOSIS — H401131 Primary open-angle glaucoma, bilateral, mild stage: Secondary | ICD-10-CM | POA: Diagnosis not present

## 2022-08-12 DIAGNOSIS — H43813 Vitreous degeneration, bilateral: Secondary | ICD-10-CM | POA: Diagnosis not present

## 2022-08-12 DIAGNOSIS — H401131 Primary open-angle glaucoma, bilateral, mild stage: Secondary | ICD-10-CM | POA: Diagnosis not present

## 2022-09-01 DIAGNOSIS — R7303 Prediabetes: Secondary | ICD-10-CM | POA: Diagnosis not present

## 2022-09-01 DIAGNOSIS — I10 Essential (primary) hypertension: Secondary | ICD-10-CM | POA: Diagnosis not present

## 2022-09-01 DIAGNOSIS — E7800 Pure hypercholesterolemia, unspecified: Secondary | ICD-10-CM | POA: Diagnosis not present

## 2022-09-01 DIAGNOSIS — R54 Age-related physical debility: Secondary | ICD-10-CM | POA: Diagnosis not present

## 2022-09-01 DIAGNOSIS — E039 Hypothyroidism, unspecified: Secondary | ICD-10-CM | POA: Diagnosis not present

## 2022-09-29 DIAGNOSIS — Z713 Dietary counseling and surveillance: Secondary | ICD-10-CM | POA: Diagnosis not present

## 2022-10-28 DIAGNOSIS — J309 Allergic rhinitis, unspecified: Secondary | ICD-10-CM | POA: Diagnosis not present

## 2022-10-28 DIAGNOSIS — I10 Essential (primary) hypertension: Secondary | ICD-10-CM | POA: Diagnosis not present

## 2022-11-11 DIAGNOSIS — H401131 Primary open-angle glaucoma, bilateral, mild stage: Secondary | ICD-10-CM | POA: Diagnosis not present

## 2022-11-11 DIAGNOSIS — H43813 Vitreous degeneration, bilateral: Secondary | ICD-10-CM | POA: Diagnosis not present

## 2022-11-20 DIAGNOSIS — R42 Dizziness and giddiness: Secondary | ICD-10-CM | POA: Diagnosis not present

## 2022-11-20 DIAGNOSIS — H903 Sensorineural hearing loss, bilateral: Secondary | ICD-10-CM | POA: Diagnosis not present

## 2023-01-13 ENCOUNTER — Ambulatory Visit: Payer: Medicare PPO | Admitting: Cardiology

## 2023-01-13 ENCOUNTER — Encounter: Payer: Self-pay | Admitting: Cardiology

## 2023-01-13 VITALS — BP 140/78 | HR 72 | Ht 66.0 in | Wt 134.0 lb

## 2023-01-13 DIAGNOSIS — I10 Essential (primary) hypertension: Secondary | ICD-10-CM

## 2023-01-13 DIAGNOSIS — I452 Bifascicular block: Secondary | ICD-10-CM

## 2023-01-13 DIAGNOSIS — I351 Nonrheumatic aortic (valve) insufficiency: Secondary | ICD-10-CM

## 2023-01-13 NOTE — Progress Notes (Signed)
Primary Physician/Referring:  Aliene Beams, MD  Patient ID: Sonya Gross, female    DOB: 1930/04/23, 89 y.o.   MRN: [id number]  No chief complaint on file.  HPI:    Sonya Gross  is a 89 y.o. pleasant African-American female with mild to moderate aortic regurgitation, bifascicular block on the EKG, hypertension, mixed hyperlipidemia, presents here for follow-up of heart disease.  She is essentially asymptomatic.  Denied any dizziness or palpitations this time.   Past Medical History:  Diagnosis Date   Abnormal glucose    Bifascicular block 04/03/2019   Cataract    Dizziness    Dizziness    Hot flashes    HTN (hypertension) 04/03/2019   Hyperlipidemia    Multiple nodules of lung    Nonrheumatic aortic (valve) insufficiency 04/03/2019   Osteopenia    Palpitations    Thyroid disease    Vitamin D deficiency    Past Surgical History:  Procedure Laterality Date   ABDOMINAL HYSTERECTOMY     APPENDECTOMY  1975   CATARACT EXTRACTION, BILATERAL  2010   CHOLECYSTECTOMY  2003   Family History  Problem Relation Age of Onset   Uterine cancer Mother        died at age 84   Healthy Father        died at age 91   Mental illness Sister     Social History   Tobacco Use   Smoking status: Never   Smokeless tobacco: Never  Substance Use Topics   Alcohol use: Never    Alcohol/week: 0.0 standard drinks of alcohol   ROS  Review of Systems  Cardiovascular:  Negative for chest pain, dyspnea on exertion, leg swelling and palpitations.   Objective  There were no vitals taken for this visit.     01/12/2022   10:35 AM 11/11/2021   11:19 AM 09/02/2021   11:22 AM  Vitals with BMI  Height 5\' 6"  5\' 6"    Weight 132 lbs 6 oz 134 lbs   BMI 21.38 21.64   Systolic 114 135 829  Diastolic 69 78 68  Pulse 83 80 89     Physical Exam Neck:     Vascular: No carotid bruit or JVD.  Cardiovascular:     Rate and Rhythm: Normal rate and regular rhythm.     Pulses: Intact distal pulses.      Heart sounds: Murmur heard.     Decrescendo early diastolic murmur is present with a grade of 2/4 at the upper right sternal border and upper left sternal border.     No gallop.  Pulmonary:     Effort: Pulmonary effort is normal.     Breath sounds: Normal breath sounds.  Abdominal:     General: Bowel sounds are normal.     Palpations: Abdomen is soft.  Musculoskeletal:     Right lower leg: No edema.     Left lower leg: No edema.    Laboratory examination:  External labs:  A1C 6.300 % 06/17/2022 TSH 3.720 06/17/2022  Hemoglobin 13.900 g/d 06/17/2022  Creatinine, Serum 0.640 mg/ 06/17/2022 Potassium 4.800 mm 06/17/2022 ALT (SGPT) 12.000 U/L 06/17/2022  Labs 12/12/2021:  BUN 16, creatinine 0.63, EGFR 77 mL, potassium 4.4.  LFTs normal.  Vitamin D 43.2.  Total cholesterol 148, triglycerides 64, HDL 58, LDL 77.  A1c 6.0%. Radiology:   MRI brain 07/07/2019: No acute intracranial abnormality Age-appropriate atrophy. Moderate chronic microvascular ischemic changes  Cardiac Studies:     Treadmill stress  test  [07/29/2015]: Indication:RBBB, Abnormal ECG The patient exercised according to Bruce Protocol, Total time recorded 3:01 min achieving max heart rate of 132 which was 96% of MPHR for age and 4.64 METS of work. Normal BP response. Resting ECG showing a NSR with a RBBB. There was no ST-T changes of ischemia with exercise stress test. Stress terminated due to fatigue and THR >85% MPHR met. Rare PVC. Markedly reduced exercise capacity and aerobic threshold.  Event monitor 04/04/2019 - 04/17/2019.  Baseline sample showed Sinus Rhythm w/Artifact with a heart rate of 84.2 bpm. There were 0 critical, 0 serious, and 39 stable events that occurred.  Symptomatic transmissions of dyspnea and lightheadedness revealed sinus rhythm/sinus tachycardia.  No other significant arrhythmias noted.  Echocardiogram 05/12/2019: Left ventricle cavity is normal in size. Normal left ventricular wall  thickness. Normal LV systolic function with EF 61%. Normal global wall motion. Doppler evidence of grade I (impaired) diastolic dysfunction, normal LAP.  Trileaflet aortic valve. Mild aortic valve leaflet calcification. Moderate (Grade II) aortic regurgitation. Moderate (Grade III) mitral regurgitation. Mild tricuspid regurgitation.  No evidence of pulmonary hypertension. Compared to previous study in 2017, mitral and tricuspid regurgitation increased in severity.   Carotid artery duplex 09/01/2021: Right Carotid: Velocities in the right ICA are consistent with a 1-39% stenosis.  Left Carotid: Velocities in the left ICA are consistent with a 1-39% stenosis.  Vertebrals:  Bilateral vertebral arteries demonstrate antegrade flow. Subclavians: Normal flow hemodynamics were seen in bilateral subclavian arteries  EKG: EKG 01/13/2023: Normal sinus rhythm with rate of 67 bpm, left axis deviation, left anterior fascicular block.  Right bundle branch block.  No evidence of ischemia.  Compared to 01/12/2022, no significant change.     Medications and allergies   Allergies  Allergen Reactions   Shellfish Allergy Itching    Pt's daughter states she had to go to the ER.   Penicillins Swelling and Rash    Did it involve swelling of the face/tongue/throat, SOB, or low BP? No Did it involve sudden or severe rash/hives, skin peeling, or any reaction on the inside of your mouth or nose? Yes Did you need to seek medical attention at a hospital or doctor's office? No When did it last happen?     Over 10 years ago  If all above answers are "NO", may proceed with cephalosporin use.     Current Outpatient Medications:    B Complex Vitamins (VITAMIN B COMPLEX) TABS, Take 1 tablet by mouth See admin instructions., Disp: , Rfl:    bimatoprost (LUMIGAN) 0.01 % SOLN, Place 1 drop into the right eye at bedtime., Disp: , Rfl:    Cholecalciferol (VITAMIN D3 PO), Take 1 tablet by mouth daily., Disp: , Rfl:     gabapentin (NEURONTIN) 100 MG capsule, Take 1 capsule by mouth daily., Disp: , Rfl:    hydrALAZINE (APRESOLINE) 25 MG tablet, TAKE 1 TABLET(25 MG) BY MOUTH THREE TIMES DAILY, Disp: 270 tablet, Rfl: 3   levothyroxine (SYNTHROID) 25 MCG tablet, Take 25 mcg by mouth daily before breakfast. , Disp: , Rfl:    Multiple Vitamins-Minerals (PRESERVISION/LUTEIN) CAPS, Take 1 capsule by mouth daily., Disp: , Rfl:    rosuvastatin (CRESTOR) 20 MG tablet, Take 1 tablet by mouth daily., Disp: , Rfl:     Assessment     ICD-10-CM   1. Bifascicular block  I45.2     2. Essential hypertension  I10     3. Moderate aortic regurgitation  I35.1  No orders of the defined types were placed in this encounter.   There are no discontinued medications.  No orders of the defined types were placed in this encounter.    Recommendations:   Sonya Gross  is a 89 y.o. pleasant African-American female with mild to moderate aortic regurgitation, bifascicular block on the EKG, hypertension, mixed hyperlipidemia, presents here for annual follow-up.    1. Bifascicular block I reviewed an EKG, no change from prior EKG and she does have underlying bifascicular block.  She has not had any dizziness or decreased exercise tolerance.  She continues to remain active and independent.  - EKG 12-Lead  2. Essential hypertension Blood pressure is under excellent control.  No changes in the medications were done today.  3. Moderate aortic regurgitation Mild to moderate aortic regurgitation appears to be stable clinically.  No change in murmur, patient remains asymptomatic, no change in the EKG as well.  It has been 4 years since last echocardiogram, hence I will repeat echocardiogram and unless this is abnormal I will see her back again in a year or sooner if problems.    Yates Decamp, MD, [facility] 01/13/2023, 7:27 AM Office: [phone] Pager: [phone]

## 2023-01-31 DIAGNOSIS — R600 Localized edema: Secondary | ICD-10-CM | POA: Diagnosis not present

## 2023-01-31 DIAGNOSIS — E785 Hyperlipidemia, unspecified: Secondary | ICD-10-CM | POA: Diagnosis not present

## 2023-01-31 DIAGNOSIS — H409 Unspecified glaucoma: Secondary | ICD-10-CM | POA: Diagnosis not present

## 2023-01-31 DIAGNOSIS — M810 Age-related osteoporosis without current pathological fracture: Secondary | ICD-10-CM | POA: Diagnosis not present

## 2023-01-31 DIAGNOSIS — E039 Hypothyroidism, unspecified: Secondary | ICD-10-CM | POA: Diagnosis not present

## 2023-01-31 DIAGNOSIS — G629 Polyneuropathy, unspecified: Secondary | ICD-10-CM | POA: Diagnosis not present

## 2023-01-31 DIAGNOSIS — H3530 Unspecified macular degeneration: Secondary | ICD-10-CM | POA: Diagnosis not present

## 2023-01-31 DIAGNOSIS — I10 Essential (primary) hypertension: Secondary | ICD-10-CM | POA: Diagnosis not present

## 2023-01-31 DIAGNOSIS — J309 Allergic rhinitis, unspecified: Secondary | ICD-10-CM | POA: Diagnosis not present

## 2023-02-11 DIAGNOSIS — H401131 Primary open-angle glaucoma, bilateral, mild stage: Secondary | ICD-10-CM | POA: Diagnosis not present

## 2023-02-11 DIAGNOSIS — H43813 Vitreous degeneration, bilateral: Secondary | ICD-10-CM | POA: Diagnosis not present

## 2023-02-25 ENCOUNTER — Ambulatory Visit: Payer: Medicare PPO

## 2023-02-25 DIAGNOSIS — I351 Nonrheumatic aortic (valve) insufficiency: Secondary | ICD-10-CM | POA: Diagnosis not present

## 2023-03-03 NOTE — Progress Notes (Signed)
Echocardiogram 02/25/2023: Left ventricle cavity is normal in size. Normal left ventricular wall thickness. Normal global wall motion. Normal LV systolic function with EF 65%. Doppler evidence of grade I (impaired) diastolic dysfunction, normal LAP. Calculated EF 65%. Trileaflet aortic valve. Mild aortic valve leaflet calcification. Mild (Grade I) aortic regurgitation. Structurally normal tricuspid valve. Mild tricuspid regurgitation.  No evidence of pulmonary hypertension. No significant change compared to previous study on 01/19/2022.

## 2023-03-08 DIAGNOSIS — Z1231 Encounter for screening mammogram for malignant neoplasm of breast: Secondary | ICD-10-CM | POA: Diagnosis not present

## 2023-03-24 DIAGNOSIS — R42 Dizziness and giddiness: Secondary | ICD-10-CM | POA: Diagnosis not present

## 2023-03-24 DIAGNOSIS — M8589 Other specified disorders of bone density and structure, multiple sites: Secondary | ICD-10-CM | POA: Diagnosis not present

## 2023-03-24 DIAGNOSIS — R7303 Prediabetes: Secondary | ICD-10-CM | POA: Diagnosis not present

## 2023-03-24 DIAGNOSIS — E039 Hypothyroidism, unspecified: Secondary | ICD-10-CM | POA: Diagnosis not present

## 2023-03-24 DIAGNOSIS — I700 Atherosclerosis of aorta: Secondary | ICD-10-CM | POA: Diagnosis not present

## 2023-03-24 DIAGNOSIS — E7800 Pure hypercholesterolemia, unspecified: Secondary | ICD-10-CM | POA: Diagnosis not present

## 2023-03-24 DIAGNOSIS — I10 Essential (primary) hypertension: Secondary | ICD-10-CM | POA: Diagnosis not present

## 2023-03-24 DIAGNOSIS — Z0000 Encounter for general adult medical examination without abnormal findings: Secondary | ICD-10-CM | POA: Diagnosis not present

## 2023-04-29 DIAGNOSIS — M8588 Other specified disorders of bone density and structure, other site: Secondary | ICD-10-CM | POA: Diagnosis not present

## 2023-04-29 DIAGNOSIS — Z8262 Family history of osteoporosis: Secondary | ICD-10-CM | POA: Diagnosis not present

## 2023-04-29 DIAGNOSIS — R29890 Loss of height: Secondary | ICD-10-CM | POA: Diagnosis not present

## 2023-05-01 ENCOUNTER — Emergency Department (HOSPITAL_COMMUNITY): Payer: Medicare PPO

## 2023-05-01 ENCOUNTER — Other Ambulatory Visit: Payer: Self-pay

## 2023-05-01 ENCOUNTER — Encounter (HOSPITAL_COMMUNITY): Payer: Self-pay

## 2023-05-01 ENCOUNTER — Emergency Department (HOSPITAL_COMMUNITY)
Admission: EM | Admit: 2023-05-01 | Discharge: 2023-05-01 | Disposition: A | Discharge status: Home / Self Care | Payer: Medicare PPO | Attending: Emergency Medicine | Admitting: Emergency Medicine

## 2023-05-01 DIAGNOSIS — R002 Palpitations: Secondary | ICD-10-CM | POA: Insufficient documentation

## 2023-05-01 DIAGNOSIS — R918 Other nonspecific abnormal finding of lung field: Secondary | ICD-10-CM | POA: Diagnosis not present

## 2023-05-01 DIAGNOSIS — I10 Essential (primary) hypertension: Secondary | ICD-10-CM | POA: Insufficient documentation

## 2023-05-01 DIAGNOSIS — I498 Other specified cardiac arrhythmias: Secondary | ICD-10-CM | POA: Diagnosis not present

## 2023-05-01 DIAGNOSIS — E039 Hypothyroidism, unspecified: Secondary | ICD-10-CM | POA: Diagnosis not present

## 2023-05-01 DIAGNOSIS — I700 Atherosclerosis of aorta: Secondary | ICD-10-CM | POA: Diagnosis not present

## 2023-05-01 DIAGNOSIS — I499 Cardiac arrhythmia, unspecified: Secondary | ICD-10-CM | POA: Diagnosis not present

## 2023-05-01 DIAGNOSIS — Z79899 Other long term (current) drug therapy: Secondary | ICD-10-CM | POA: Insufficient documentation

## 2023-05-01 DIAGNOSIS — I4891 Unspecified atrial fibrillation: Secondary | ICD-10-CM | POA: Diagnosis not present

## 2023-05-01 DIAGNOSIS — J984 Other disorders of lung: Secondary | ICD-10-CM | POA: Diagnosis not present

## 2023-05-01 DIAGNOSIS — I4510 Unspecified right bundle-branch block: Secondary | ICD-10-CM | POA: Diagnosis not present

## 2023-05-01 LAB — BASIC METABOLIC PANEL
Anion gap: 8 (ref 5–15)
BUN: 13 mg/dL (ref 8–23)
CO2: 26 mmol/L (ref 22–32)
Calcium: 9.1 mg/dL (ref 8.9–10.3)
Chloride: 104 mmol/L (ref 98–111)
Creatinine, Ser: 0.68 mg/dL (ref 0.44–1.00)
GFR, Estimated: >60 mL/min (ref >60)
Glucose, Bld: 108 mg/dL — ABNORMAL HIGH (ref 70–99)
Potassium: 3.7 mmol/L (ref 3.5–5.1)
Sodium: 138 mmol/L (ref 135–145)

## 2023-05-01 LAB — CBC
HCT: 44.9 % (ref 36.0–46.0)
Hemoglobin: 14.6 g/dL (ref 12.0–15.0)
MCH: 30.1 pg (ref 26.0–34.0)
MCHC: 32.5 g/dL (ref 30.0–36.0)
MCV: 92.6 fL (ref 80.0–100.0)
Platelets: 145 10*3/uL — ABNORMAL LOW (ref 150–400)
RBC: 4.85 MIL/uL (ref 3.87–5.11)
RDW: 14.9 % (ref 11.5–15.5)
WBC: 3.8 10*3/uL — ABNORMAL LOW (ref 4.0–10.5)
nRBC: 0 % (ref 0.0–0.2)

## 2023-05-01 LAB — MAGNESIUM: Magnesium: 2.3 mg/dL (ref 1.7–2.4)

## 2023-05-01 LAB — TSH: TSH: 4.554 u[IU]/mL — ABNORMAL HIGH (ref 0.350–4.500)

## 2023-05-01 NOTE — ED Triage Notes (Signed)
Pt arrived POV from urgent care c/o palpitations that started this morning but denies any pain or SHOB.

## 2023-05-01 NOTE — Discharge Instructions (Signed)
Testing done today is reassuring.  you remained in a normal heart rate and rhythm while in the emergency department.  You should hear from Dr. Verl Dicker office to set up a close follow-up appointment.  If you do not hear from them in the next couple days, call the number below.  You may benefit from a wearable cardiac monitor.  Return to the emergency department for any further symptoms of concern.

## 2023-05-01 NOTE — ED Provider Notes (Signed)
[place] EMERGENCY DEPARTMENT AT [facility] Provider Note   CSN: [id number] Arrival date & time: 05/01/23  1014     History  Chief Complaint  Patient presents with   Palpitations    Sonya Gross is a 89 y.o. female.   Palpitations Patient presents for potation's.  Medical history includes HTN, HLD, hypothyroidism, osteopenia, bifascicular block.  She underwent an echocardiogram 2 months ago which showed grade 1 impaired diastolic dysfunction with mild aortic and mitral valve regurgitation.  She is followed by Dr. Jacinto Halim.  Home medications include Synthroid, hydralazine.  This morning, she woke up in her normal state of health.  Patient lives independently.  Later in the morning, she began to experience palpitations.  She states that he did feel like her heart was racing at the time.  When this occurred, she did not have any associated chest pain, shortness of breath, lightheadedness, or dizziness.  Episode lasted for 5 to 10 minutes.  She was seen urgent care prior to arrival.  At urgent care, patient was in sinus rhythm with heart rate of 71.  Blood pressure was normal.  She was sent to the ED for further evaluation.  Patient currently remains asymptomatic.     Home Medications Prior to Admission medications   Medication Sig Start Date End Date Taking? Authorizing Provider  B Complex Vitamins (VITAMIN B COMPLEX) TABS Take 1 tablet by mouth See admin instructions.    [provider]  bimatoprost (LUMIGAN) 0.01 % SOLN Place 1 drop into the right eye at bedtime.    [provider]  Cholecalciferol (VITAMIN D3 PO) Take 1 tablet by mouth daily.    [provider]  gabapentin (NEURONTIN) 100 MG capsule Take 1 capsule by mouth daily. 06/11/20   [provider]  hydrALAZINE (APRESOLINE) 25 MG tablet TAKE 1 TABLET(25 MG) BY MOUTH THREE TIMES DAILY 04/17/22   Yates Decamp, MD  levothyroxine (SYNTHROID) 25 MCG tablet Take 25 mcg by mouth daily  before breakfast.     [provider]  Multiple Vitamins-Minerals (PRESERVISION/LUTEIN) CAPS Take 1 capsule by mouth daily.    [provider]  rosuvastatin (CRESTOR) 20 MG tablet Take 1 tablet by mouth daily.    [provider]      Allergies    Other, Penicillins, and Shellfish allergy    Review of Systems   Review of Systems  Cardiovascular:  Positive for palpitations.  All other systems reviewed and are negative.   Physical Exam Updated Vital Signs BP 135/70   Pulse 66   Temp 97.8 F (36.6 C) (Oral)   Resp 15   Ht 5\' 4"  (1.626 m)   Wt 59.4 kg   SpO2 99%   BMI 22.49 kg/m  Physical Exam Vitals and nursing note reviewed.  Constitutional:      General: She is not in acute distress.    Appearance: Normal appearance. She is well-developed. She is not ill-appearing, toxic-appearing or diaphoretic.  HENT:     Head: Normocephalic and atraumatic.     Right Ear: External ear normal.     Left Ear: External ear normal.     Nose: Nose normal.     Mouth/Throat:     Mouth: Mucous membranes are moist.  Eyes:     Extraocular Movements: Extraocular movements intact.     Conjunctiva/sclera: Conjunctivae normal.  Cardiovascular:     Rate and Rhythm: Normal rate and regular rhythm.     Heart sounds: No murmur heard. Pulmonary:  Effort: Pulmonary effort is normal. No respiratory distress.     Breath sounds: Normal breath sounds. No wheezing or rales.  Chest:     Chest wall: No tenderness.  Abdominal:     General: There is no distension.     Palpations: Abdomen is soft.     Tenderness: There is no abdominal tenderness.  Musculoskeletal:        General: No swelling. Normal range of motion.     Cervical back: Normal range of motion and neck supple.     Right lower leg: No edema.     Left lower leg: No edema.  Skin:    General: Skin is warm and dry.     Coloration: Skin is not jaundiced or pale.  Neurological:     General: No focal deficit present.      Mental Status: She is alert and oriented to person, place, and time.  Psychiatric:        Mood and Affect: Mood normal.        Behavior: Behavior normal.     ED Results / Procedures / Treatments   Labs (all labs ordered are listed, but only abnormal results are displayed) Labs Reviewed  BASIC METABOLIC PANEL - Abnormal; Notable for the following components:      Result Value   Glucose, Bld 108 (*)    All other components within normal limits  CBC - Abnormal; Notable for the following components:   WBC 3.8 (*)    Platelets 145 (*)    All other components within normal limits  TSH - Abnormal; Notable for the following components:   TSH 4.554 (*)    All other components within normal limits  MAGNESIUM    EKG EKG Interpretation Date/Time:  Saturday May 01 2023 10:31:19 EDT Ventricular Rate:  70 PR Interval:  162 QRS Duration:  124 QT Interval:  392 QTC Calculation: 423 R Axis:   266  Text Interpretation: Normal sinus rhythm Right bundle branch block Confirmed by Gloris Manchester [phone]) on 05/01/2023 12:44:24 PM  Radiology CT Chest Wo Contrast  Result Date: 05/01/2023 CLINICAL DATA:  No chest pain or shortness of breath. Possible pulmonary nodule. EXAM: CT CHEST WITHOUT CONTRAST TECHNIQUE: Multidetector CT imaging of the chest was performed following the standard protocol without IV contrast. RADIATION DOSE REDUCTION: This exam was performed according to the departmental dose-optimization program which includes automated exposure control, adjustment of the mA and/or kV according to patient size and/or use of iterative reconstruction technique. COMPARISON:  02/04/2019, 01/20/2016, 10/17/2014 FINDINGS: Cardiovascular: No significant vascular findings. Normal heart size. No pericardial effusion. Multi vessel coronary artery atherosclerosis. Thoracic aortic atherosclerosis. Mediastinum/Nodes: No enlarged mediastinal or axillary lymph nodes. Thyroid gland, trachea, and esophagus  demonstrate no significant findings. Lungs/Pleura: 7 mm pulmonary nodule in the superior segment of the right lower lobe slightly smaller compared with the prior examination of 01/20/2016. 7.7 mm pulmonary nodule in the medial aspect of the right lower lobe unchanged compared with the prior examination of 01/20/2016 likely reflecting postinflammatory scarring. Stable 5 mm subpleural left lower lobe pulmonary nodule unchanged compared with 01/20/2016. 3 x 5 mm stable left lower lobe pulmonary nodule unchanged. 6 mm stable left lower lobe pulmonary nodule along the major fissure. No pleural effusion or pneumothorax. No focal consolidation. Upper Abdomen: No acute abnormality. Musculoskeletal: No chest wall mass or suspicious bone lesions identified. Anterior bridging osteophytes of the thoracolumbar spine as can be seen with diffuse idiopathic skeletal hyperostosis. Calcified central disc protrusion at  L1-2. IMPRESSION: 1. No acute cardiopulmonary disease. 2. Multiple bilateral pulmonary nodules unchanged compared with the prior examination of 01/20/2016. 3.  Aortic Atherosclerosis (ICD10-I70.0). Electronically Signed   By: Elige Ko M.D.   On: 05/01/2023 14:12   DG Chest 1 View  Result Date: 05/01/2023 CLINICAL DATA:  Atrial fibrillation. EXAM: CHEST  1 VIEW COMPARISON:  02/04/2019 FINDINGS: Lungs are hyperexpanded. The lungs are clear without focal pneumonia, edema, pneumothorax or pleural effusion. Nodular densities project over each upper lobe, probably adhesive fat for cardiac leads. The cardiopericardial silhouette is within normal limits for size. No acute bony abnormality. IMPRESSION: Hyperexpansion without acute cardiopulmonary findings. Nodular densities project over each upper lobe, likely adhesive pads for cardiac leads. Repeat frontal radiograph after removal of pads recommended. In the absence of pads on this patient, chest CT without contrast to exclude pulmonary nodule. Electronically Signed    By: Kennith Center M.D.   On: 05/01/2023 11:12    Procedures Procedures    Medications Ordered in ED Medications - No data to display  ED Course/ Medical Decision Making/ A&P                                 Medical Decision Making Amount and/or Complexity of Data Reviewed Labs: ordered. Radiology: ordered.   This patient presents to the ED for concern of palpitations, this involves an extensive number of treatment options, and is a complaint that carries with it a high risk of complications and morbidity.  The differential diagnosis includes arrhythmia, normal heart rate variability, anxiety, metabolic derangements   Co morbidities that complicate the patient evaluation  HTN, HLD, hypothyroidism, osteopenia, bifascicular block   Additional history obtained:  Additional history obtained from N/A External records from outside source obtained and reviewed including EMR   Lab Tests:  I Ordered, and personally interpreted labs.  The pertinent results include: Normal hemoglobin, baseline low-normal WBC, normal kidney function, normal electrolytes, high-normal TSH   Imaging Studies ordered:  I ordered imaging studies including chest x-ray, CT chest I independently visualized and interpreted imaging which showed baseline pulmonary nodules with no acute findings I agree with the radiologist interpretation   Cardiac Monitoring: / EKG:  The patient was maintained on a cardiac monitor.  I personally viewed and interpreted the cardiac monitored which showed an underlying rhythm of: Sinus rhythm   Problem List / ED Course / Critical interventions / Medication management  Patient presents following a transient episode of palpitations this morning.  On arrival in the ED, she is well-appearing.  Vital signs are normal.  Monitor shows normal sinus rhythm with a normal heart rate.  She has RBBB which has been present on prior EKG tracings.  Patient was kept on bedside cardiac monitor.   Laboratory workup was initiated.  On chest x-ray, there was concern of pulmonary nodule.  CT of chest was ordered to further evaluate.  On CT, she has pulmonary nodules that appear unchanged over the past 7 years.  Lab results are unremarkable.  Patient remained asymptomatic while in the ED.  She remained in normal sinus rhythm.  She was advised to follow-up with her cardiologist, Dr. Jacinto Halim.  She was discharged in stable condition.   Social Determinants of Health:  Has access to outpatient care, lives independently         Final Clinical Impression(s) / ED Diagnoses Final diagnoses:  Palpitations    Rx / DC Orders ED  Discharge Orders          Ordered    Ambulatory referral to Cardiology       Comments: If you have not heard from the Cardiology office within the next 72 hours please call [phone].   05/01/23 1421              Gloris Manchester, MD 05/01/23 1434

## 2023-05-02 DIAGNOSIS — I4510 Unspecified right bundle-branch block: Secondary | ICD-10-CM | POA: Diagnosis not present

## 2023-05-02 DIAGNOSIS — I499 Cardiac arrhythmia, unspecified: Secondary | ICD-10-CM | POA: Diagnosis not present

## 2023-05-28 DIAGNOSIS — R42 Dizziness and giddiness: Secondary | ICD-10-CM | POA: Diagnosis not present

## 2023-05-28 DIAGNOSIS — H903 Sensorineural hearing loss, bilateral: Secondary | ICD-10-CM | POA: Diagnosis not present

## 2023-07-07 DIAGNOSIS — E559 Vitamin D deficiency, unspecified: Secondary | ICD-10-CM | POA: Diagnosis not present

## 2023-07-07 DIAGNOSIS — I700 Atherosclerosis of aorta: Secondary | ICD-10-CM | POA: Diagnosis not present

## 2023-07-07 DIAGNOSIS — R7303 Prediabetes: Secondary | ICD-10-CM | POA: Diagnosis not present

## 2023-07-07 DIAGNOSIS — M810 Age-related osteoporosis without current pathological fracture: Secondary | ICD-10-CM | POA: Diagnosis not present

## 2023-07-07 DIAGNOSIS — Z23 Encounter for immunization: Secondary | ICD-10-CM | POA: Diagnosis not present

## 2023-07-08 ENCOUNTER — Other Ambulatory Visit: Payer: Self-pay | Admitting: Cardiology

## 2023-07-08 DIAGNOSIS — I351 Nonrheumatic aortic (valve) insufficiency: Secondary | ICD-10-CM

## 2023-07-08 DIAGNOSIS — I10 Essential (primary) hypertension: Secondary | ICD-10-CM

## 2023-08-17 DIAGNOSIS — H401131 Primary open-angle glaucoma, bilateral, mild stage: Secondary | ICD-10-CM | POA: Diagnosis not present

## 2023-08-17 DIAGNOSIS — H43813 Vitreous degeneration, bilateral: Secondary | ICD-10-CM | POA: Diagnosis not present

## 2023-08-17 DIAGNOSIS — H35033 Hypertensive retinopathy, bilateral: Secondary | ICD-10-CM | POA: Diagnosis not present

## 2023-08-30 DIAGNOSIS — R509 Fever, unspecified: Secondary | ICD-10-CM | POA: Diagnosis not present

## 2023-08-30 DIAGNOSIS — J069 Acute upper respiratory infection, unspecified: Secondary | ICD-10-CM | POA: Diagnosis not present

## 2023-08-30 DIAGNOSIS — R051 Acute cough: Secondary | ICD-10-CM | POA: Diagnosis not present

## 2023-09-24 DIAGNOSIS — E039 Hypothyroidism, unspecified: Secondary | ICD-10-CM | POA: Diagnosis not present

## 2023-09-24 DIAGNOSIS — I700 Atherosclerosis of aorta: Secondary | ICD-10-CM | POA: Diagnosis not present

## 2023-09-24 DIAGNOSIS — E7800 Pure hypercholesterolemia, unspecified: Secondary | ICD-10-CM | POA: Diagnosis not present

## 2023-09-24 DIAGNOSIS — R7303 Prediabetes: Secondary | ICD-10-CM | POA: Diagnosis not present

## 2023-09-24 DIAGNOSIS — I10 Essential (primary) hypertension: Secondary | ICD-10-CM | POA: Diagnosis not present

## 2023-10-08 DIAGNOSIS — R0981 Nasal congestion: Secondary | ICD-10-CM | POA: Diagnosis not present

## 2023-10-08 DIAGNOSIS — R6883 Chills (without fever): Secondary | ICD-10-CM | POA: Diagnosis not present

## 2023-10-08 DIAGNOSIS — J200 Acute bronchitis due to Mycoplasma pneumoniae: Secondary | ICD-10-CM | POA: Diagnosis not present

## 2023-10-08 DIAGNOSIS — R051 Acute cough: Secondary | ICD-10-CM | POA: Diagnosis not present

## 2023-10-10 ENCOUNTER — Encounter (HOSPITAL_COMMUNITY): Payer: Self-pay

## 2023-10-10 ENCOUNTER — Emergency Department (HOSPITAL_COMMUNITY): Payer: Medicare PPO

## 2023-10-10 ENCOUNTER — Emergency Department (HOSPITAL_COMMUNITY)
Admission: EM | Admit: 2023-10-10 | Discharge: 2023-10-10 | Disposition: A | Discharge status: Home / Self Care | Payer: Medicare PPO | Attending: Emergency Medicine | Admitting: Emergency Medicine

## 2023-10-10 ENCOUNTER — Other Ambulatory Visit: Payer: Self-pay

## 2023-10-10 DIAGNOSIS — J189 Pneumonia, unspecified organism: Secondary | ICD-10-CM | POA: Insufficient documentation

## 2023-10-10 DIAGNOSIS — R0602 Shortness of breath: Secondary | ICD-10-CM | POA: Diagnosis not present

## 2023-10-10 DIAGNOSIS — R059 Cough, unspecified: Secondary | ICD-10-CM | POA: Diagnosis not present

## 2023-10-10 DIAGNOSIS — R509 Fever, unspecified: Secondary | ICD-10-CM | POA: Diagnosis present

## 2023-10-10 DIAGNOSIS — I771 Stricture of artery: Secondary | ICD-10-CM | POA: Diagnosis not present

## 2023-10-10 LAB — CBC WITH DIFFERENTIAL/PLATELET
Abs Immature Granulocytes: 0.01 10*3/uL (ref 0.00–0.07)
Basophils Absolute: 0 10*3/uL (ref 0.0–0.1)
Basophils Relative: 1 %
Eosinophils Absolute: 0.1 10*3/uL (ref 0.0–0.5)
Eosinophils Relative: 2 %
HCT: 43.7 % (ref 36.0–46.0)
Hemoglobin: 14.2 g/dL (ref 12.0–15.0)
Immature Granulocytes: 0 %
Lymphocytes Relative: 30 %
Lymphs Abs: 1.5 10*3/uL (ref 0.7–4.0)
MCH: 29.4 pg (ref 26.0–34.0)
MCHC: 32.5 g/dL (ref 30.0–36.0)
MCV: 90.5 fL (ref 80.0–100.0)
Monocytes Absolute: 0.6 10*3/uL (ref 0.1–1.0)
Monocytes Relative: 12 %
Neutro Abs: 2.8 10*3/uL (ref 1.7–7.7)
Neutrophils Relative %: 55 %
Platelets: 196 10*3/uL (ref 150–400)
RBC: 4.83 MIL/uL (ref 3.87–5.11)
RDW: 15.2 % (ref 11.5–15.5)
WBC: 5 10*3/uL (ref 4.0–10.5)
nRBC: 0 % (ref 0.0–0.2)

## 2023-10-10 LAB — BASIC METABOLIC PANEL
Anion gap: 8 (ref 5–15)
BUN: 15 mg/dL (ref 8–23)
CO2: 26 mmol/L (ref 22–32)
Calcium: 9.5 mg/dL (ref 8.9–10.3)
Chloride: 107 mmol/L (ref 98–111)
Creatinine, Ser: 0.71 mg/dL (ref 0.44–1.00)
GFR, Estimated: >60 mL/min (ref >60)
Glucose, Bld: 101 mg/dL — ABNORMAL HIGH (ref 70–99)
Potassium: 4.8 mmol/L (ref 3.5–5.1)
Sodium: 141 mmol/L (ref 135–145)

## 2023-10-10 NOTE — ED Notes (Signed)
Dr. Doran Durand at bedside assessing pt.

## 2023-10-10 NOTE — ED Triage Notes (Signed)
Recently dx with PNA.  Now complains of fever chills and bodyaches.

## 2023-10-10 NOTE — ED Provider Triage Note (Signed)
Emergency Medicine Provider Triage Evaluation Note  Sonya Gross , a 89 y.o. female  was evaluated in triage.  Pt complains of cough, congestion. Seen by PCP yesterday, provided doxycycline and taken 1 day. Came into today due to developing chills, no other worsening Sx.   Endorses chills Denies fevers, chest pain, shortness of breath, abdominal pain, n/v/d, dysuria.   Review of Systems  Positive: See above Negative: See above  Physical Exam  BP (!) 155/79 (BP Location: Right Arm)   Pulse 76   Temp 97.8 F (36.6 C)   Resp 18   Ht 5\' 4"  (1.626 m)   Wt 59.4 kg   SpO2 95%   BMI 22.49 kg/m  Gen:   Awake, no distress   Resp:  Normal effort  MSK:   Moves extremities without difficulty  Other:    Medical Decision Making  Medically screening exam initiated at 1:23 PM.  Appropriate orders placed.  MORENE CECILIO was informed that the remainder of the evaluation will be completed by another provider, this initial triage assessment does not replace that evaluation, and the importance of remaining in the ED until their evaluation is complete.     Lunette Stands, [place] 10/10/23 1325

## 2023-10-10 NOTE — Discharge Instructions (Signed)
Continue your medication as prescribed and follow-up with your primary care doctor within 3 days.  If you have any worsening symptoms you need to return to the emergency department for further evaluation.

## 2023-10-10 NOTE — ED Provider Notes (Signed)
[place] EMERGENCY DEPARTMENT AT [facility] Provider Note   CSN: [id number] Arrival date & time: 10/10/23  1300     History Chief Complaint  Patient presents with   Pneumonia    HPI Sonya Gross is a 89 y.o. female presenting for chief complaint of fever cough congestion.  89 year old female, extensive medical history.  States that she was diagnosed with pneumonia started on doxycycline 2 days ago but still having ongoing symptoms. No acute distress but endorsed ongoing symptoms. She stated that she is going to be rechecked. Endorses subjective fevers, chills, dyspnea that is improving. CoughThat is also improving.  Denies any abdominal pain or dysuria.   Patient's recorded medical, surgical, social, medication list and allergies were reviewed in the Snapshot window as part of the initial history.   Review of Systems   Review of Systems  Constitutional:  Positive for chills. Negative for fever.  HENT:  Negative for ear pain and sore throat.   Eyes:  Negative for pain and visual disturbance.  Respiratory:  Positive for cough. Negative for shortness of breath.   Cardiovascular:  Negative for chest pain and palpitations.  Gastrointestinal:  Negative for abdominal pain and vomiting.  Genitourinary:  Negative for dysuria and hematuria.  Musculoskeletal:  Negative for arthralgias and back pain.  Skin:  Negative for color change and rash.  Neurological:  Negative for seizures and syncope.  All other systems reviewed and are negative.   Physical Exam Updated Vital Signs BP 126/80   Pulse 70   Temp 98.2 F (36.8 C) (Oral)   Resp 17   Ht 5\' 4"  (1.626 m)   Wt 59.4 kg   SpO2 100%   BMI 22.49 kg/m  Physical Exam Vitals and nursing note reviewed.  Constitutional:      General: She is not in acute distress.    Appearance: She is well-developed.  HENT:     Head: Normocephalic and atraumatic.  Eyes:     Conjunctiva/sclera: Conjunctivae normal.   Cardiovascular:     Rate and Rhythm: Normal rate and regular rhythm.     Heart sounds: No murmur heard. Pulmonary:     Effort: Pulmonary effort is normal. No respiratory distress.     Breath sounds: Normal breath sounds.  Abdominal:     General: There is no distension.     Palpations: Abdomen is soft.     Tenderness: There is no abdominal tenderness. There is no right CVA tenderness or left CVA tenderness.  Musculoskeletal:        General: No swelling or tenderness. Normal range of motion.     Cervical back: Neck supple.  Skin:    General: Skin is warm and dry.  Neurological:     General: No focal deficit present.     Mental Status: She is alert and oriented to person, place, and time. Mental status is at baseline.     Cranial Nerves: No cranial nerve deficit.      ED Course/ Medical Decision Making/ A&P    Procedures Procedures   Medications Ordered in ED Medications - No data to display  Medical Decision Making:    LAPORCHE MARTELLE is a 89 y.o. female who presented to the ED today with [facility] detailed above.     Patient placed on continuous vitals and telemetry monitoring while in ED which was reviewed periodically.   Complete initial physical exam performed, notably the patient  was HDS in NAD.      Reviewed  and confirmed nursing documentation for past medical history, family history, social history.    Initial Assessment:   This is most consistent with an acute life/limb threatening illness complicated by underlying chronic conditions. Patient worsening generalized malaise and fatigue in the setting of known pneumonia infection. Uncertain if she is having failure of outpatient therapy and interval worsening, metabolic abnormality, dehydration or other critical hematologic abnormality. Will evaluate as below Initial Plan:  Screening labs including CBC and Metabolic panel to evaluate for infectious or metabolic etiology of disease.  CXR to evaluate for  structural/infectious intrathoracic pathology.  Objective evaluation as below reviewed with plan for close reassessment  Initial Study Results:   Laboratory  All laboratory results reviewed without evidence of clinically relevant pathology.    Radiology  All images reviewed independently. Agree with radiology report at this time.   DG Chest 2 View Result Date: 10/10/2023 CLINICAL DATA:  Worsening cough and mild shortness of breath. EXAM: CHEST - 2 VIEW COMPARISON:  AP chest 05/01/2023, chest two views 02/04/2019, CT chest 05/01/2023 FINDINGS: Cardiac silhouette and mediastinal contours are within normal limits. There is moderate atherosclerotic calcification within the aortic arch. Mildly tortuous descending thoracic aorta. Flattening of the diaphragms and moderate hyperinflation, unchanged. Small nodular density within the lateral right mid lung corresponds to a superior segment of the right lower lobe nodule that was decreased in size and 05/01/2023 compared to 01/20/2016 CT and benign. No acute airspace opacity. No pleural effusion pneumothorax. Moderate multilevel degenerative disc changes of the thoracic spine. Cholecystectomy clips. IMPRESSION: 1. No acute cardiopulmonary process. 2. Moderate hyperinflation, unchanged. Electronically Signed   By: Neita Garnet M.D.   On: 10/10/2023 14:48   Reassessment and Plan:   Patient's history of present illness and physical exam findings in the context of these objective findings reveal no acute pathology. She appears to be gradually improving stable lab work, x-ray without any acute opacities.  Recommended she continue the doxycycline follow-up in outpatient setting with her PCP within 72 hours for reassessment. Patient was comfortable with outpatient care management   Disposition:  I have considered need for hospitalization, however, considering all of the above, I believe this patient is stable for discharge at this time.  Patient/family educated  about specific return precautions for given chief complaint and symptoms.  Patient/family educated about follow-up with PCP.     Patient/family expressed understanding of return precautions and need for follow-up. Patient spoken to regarding all imaging and laboratory results and appropriate follow up for these results. All education provided in verbal form with additional information in written form. Time was allowed for answering of patient questions. Patient discharged.    Emergency Department Medication Summary:   Medications - No data to display       Clinical Impression:  1. Community acquired pneumonia, unspecified laterality      Discharge   Final Clinical Impression(s) / ED Diagnoses Final diagnoses:  Community acquired pneumonia, unspecified laterality    Rx / DC Orders ED Discharge Orders     None         Glyn Ade, MD 10/10/23 1624

## 2023-10-21 DIAGNOSIS — R053 Chronic cough: Secondary | ICD-10-CM | POA: Diagnosis not present

## 2023-11-14 ENCOUNTER — Emergency Department (HOSPITAL_COMMUNITY)
Admission: EM | Admit: 2023-11-14 | Discharge: 2023-11-14 | Disposition: A | Discharge status: Home / Self Care | Attending: Emergency Medicine | Admitting: Emergency Medicine

## 2023-11-14 ENCOUNTER — Other Ambulatory Visit: Payer: Self-pay

## 2023-11-14 ENCOUNTER — Emergency Department (HOSPITAL_COMMUNITY)

## 2023-11-14 DIAGNOSIS — S199XXA Unspecified injury of neck, initial encounter: Secondary | ICD-10-CM | POA: Diagnosis not present

## 2023-11-14 DIAGNOSIS — I10 Essential (primary) hypertension: Secondary | ICD-10-CM | POA: Diagnosis not present

## 2023-11-14 DIAGNOSIS — Y9301 Activity, walking, marching and hiking: Secondary | ICD-10-CM | POA: Diagnosis not present

## 2023-11-14 DIAGNOSIS — Z79899 Other long term (current) drug therapy: Secondary | ICD-10-CM | POA: Insufficient documentation

## 2023-11-14 DIAGNOSIS — S0083XA Contusion of other part of head, initial encounter: Secondary | ICD-10-CM | POA: Diagnosis not present

## 2023-11-14 DIAGNOSIS — Y9222 Religious institution as the place of occurrence of the external cause: Secondary | ICD-10-CM | POA: Diagnosis not present

## 2023-11-14 DIAGNOSIS — S0990XA Unspecified injury of head, initial encounter: Secondary | ICD-10-CM | POA: Insufficient documentation

## 2023-11-14 DIAGNOSIS — W108XXA Fall (on) (from) other stairs and steps, initial encounter: Secondary | ICD-10-CM | POA: Insufficient documentation

## 2023-11-14 DIAGNOSIS — I6782 Cerebral ischemia: Secondary | ICD-10-CM | POA: Diagnosis not present

## 2023-11-14 DIAGNOSIS — W19XXXA Unspecified fall, initial encounter: Secondary | ICD-10-CM

## 2023-11-14 DIAGNOSIS — E049 Nontoxic goiter, unspecified: Secondary | ICD-10-CM | POA: Diagnosis not present

## 2023-11-14 NOTE — ED Provider Notes (Signed)
 [place] EMERGENCY DEPARTMENT AT [facility] [place] Provider Note   CSN: [id number] Arrival date & time: 11/14/23  1017     History  Chief Complaint  Patient presents with   Sonya Gross is a 89 y.o. female with history of cataracts, dizziness, hyperlipidemia, osteopenia, thyroid disease.  Patient presents to ED for evaluation of fall.  States that today she was at church when she was walking down the altar stairs, tripped over one of the stairs falling face forward.  She reports that she struck the right side of her forehead on the ground which was carpeted.  She denies loss of consciousness.  Denies nausea, vomiting or photophobia.  Denies blood thinning medication.  Reports she was on the ground for "maybe a few seconds" before other church members helped her to her feet.  On exam she denies headache, rates it 3 out of 10.  Denies any preceding chest pain or shortness of breath prior to the fall.  Denies any preceding dizziness prior to the fall.  States that she just "got my feet mixed up".   Fall       Home Medications Prior to Admission medications   Medication Sig Start Date End Date Taking? Authorizing Provider  B Complex Vitamins (VITAMIN B COMPLEX) TABS Take 1 tablet by mouth See admin instructions.    [provider]  bimatoprost (LUMIGAN) 0.01 % SOLN Place 1 drop into the right eye at bedtime.    [provider]  Cholecalciferol (VITAMIN D3 PO) Take 1 tablet by mouth daily.    [provider]  gabapentin (NEURONTIN) 100 MG capsule Take 1 capsule by mouth daily. 06/11/20   [provider]  hydrALAZINE (APRESOLINE) 25 MG tablet TAKE 1 TABLET(25 MG) BY MOUTH THREE TIMES DAILY 07/08/23   Yates Decamp, MD  levothyroxine (SYNTHROID) 25 MCG tablet Take 25 mcg by mouth daily before breakfast.     [provider]  Multiple Vitamins-Minerals (PRESERVISION/LUTEIN) CAPS Take 1 capsule by mouth daily.    [provider]  rosuvastatin (CRESTOR) 20 MG tablet Take 1 tablet by mouth daily.    [provider]      Allergies    Other, Penicillins, and Shellfish allergy    Review of Systems   Review of Systems  Neurological:  Negative for syncope.  All other systems reviewed and are negative.   Physical Exam Updated Vital Signs BP (!) 152/73 (BP Location: Right Arm)   Pulse 83   Temp 97.6 F (36.4 C) (Oral)   Resp 18   Ht 5\' 6"  (1.676 m)   Wt 60.3 kg   SpO2 99%   BMI 21.47 kg/m  Physical Exam Vitals and nursing note reviewed.  Constitutional:      General: She is not in acute distress.    Appearance: Normal appearance. She is not ill-appearing, toxic-appearing or diaphoretic.  HENT:     Head: Normocephalic.      Comments: Hematoma right side of forehead    Nose: Nose normal.     Mouth/Throat:     Mouth: Mucous membranes are moist.     Pharynx: Oropharynx is clear.  Eyes:     Extraocular Movements: Extraocular movements intact.     Conjunctiva/sclera: Conjunctivae normal.     Pupils: Pupils are equal, round, and reactive to light.  Neck:     Comments: Left-sided paracervical spinal tenderness.  No centralized cervical spinal tenderness.  No step-off. Cardiovascular:  Rate and Rhythm: Normal rate and regular rhythm.  Pulmonary:     Effort: Pulmonary effort is normal.     Breath sounds: Normal breath sounds. No wheezing.  Abdominal:     General: Abdomen is flat. Bowel sounds are normal.     Palpations: Abdomen is soft.     Tenderness: There is no abdominal tenderness.  Musculoskeletal:     Cervical back: Normal range of motion. Tenderness present. No rigidity.  Neurological:     Mental Status: She is alert.     ED Results / Procedures / Treatments   Labs (all labs ordered are listed, but only abnormal results are displayed) Labs Reviewed - No data to display  EKG None  Radiology CT Head Wo Contrast Result Date: 11/14/2023 CLINICAL DATA:  Head and neck trauma.  EXAM: CT HEAD WITHOUT CONTRAST CT CERVICAL SPINE WITHOUT CONTRAST TECHNIQUE: Multidetector CT imaging of the head and cervical spine was performed following the standard protocol without intravenous contrast. Multiplanar CT image reconstructions of the cervical spine were also generated. RADIATION DOSE REDUCTION: This exam was performed according to the departmental dose-optimization program which includes automated exposure control, adjustment of the mA and/or kV according to patient size and/or use of iterative reconstruction technique. COMPARISON:  Brain MRI 07/07/2019 FINDINGS: CT HEAD FINDINGS Brain: No evidence of acute infarction, hemorrhage, hydrocephalus, extra-axial collection or mass lesion/mass effect. Encephalomalacia scattered along the surface of the left temporal and frontal lobes. Chronic small vessel ischemia in the cerebral white matter. Vascular: No hyperdense vessel or unexpected calcification. Skull: Right-sided scalp swelling.  No acute fracture. Sinuses/Orbits: Bilateral cataract resection. CT CERVICAL SPINE FINDINGS Alignment: No traumatic malalignment Skull base and vertebrae: No acute fracture Soft tissues and spinal canal: No prevertebral fluid or swelling. No visible canal hematoma. Heterogeneous enlargement of the thyroid gland, no follow-up imaging recommended based on comorbidities. Disc levels:  Generalized degenerative endplate and facet spurring. Upper chest: Negative IMPRESSION: No evidence of acute intracranial or cervical spine injury. Electronically Signed   By: Tiburcio Pea M.D.   On: 11/14/2023 11:13   CT Cervical Spine Wo Contrast Result Date: 11/14/2023 CLINICAL DATA:  Head and neck trauma. EXAM: CT HEAD WITHOUT CONTRAST CT CERVICAL SPINE WITHOUT CONTRAST TECHNIQUE: Multidetector CT imaging of the head and cervical spine was performed following the standard protocol without intravenous contrast. Multiplanar CT image reconstructions of the cervical spine were also  generated. RADIATION DOSE REDUCTION: This exam was performed according to the departmental dose-optimization program which includes automated exposure control, adjustment of the mA and/or kV according to patient size and/or use of iterative reconstruction technique. COMPARISON:  Brain MRI 07/07/2019 FINDINGS: CT HEAD FINDINGS Brain: No evidence of acute infarction, hemorrhage, hydrocephalus, extra-axial collection or mass lesion/mass effect. Encephalomalacia scattered along the surface of the left temporal and frontal lobes. Chronic small vessel ischemia in the cerebral white matter. Vascular: No hyperdense vessel or unexpected calcification. Skull: Right-sided scalp swelling.  No acute fracture. Sinuses/Orbits: Bilateral cataract resection. CT CERVICAL SPINE FINDINGS Alignment: No traumatic malalignment Skull base and vertebrae: No acute fracture Soft tissues and spinal canal: No prevertebral fluid or swelling. No visible canal hematoma. Heterogeneous enlargement of the thyroid gland, no follow-up imaging recommended based on comorbidities. Disc levels:  Generalized degenerative endplate and facet spurring. Upper chest: Negative IMPRESSION: No evidence of acute intracranial or cervical spine injury. Electronically Signed   By: Tiburcio Pea M.D.   On: 11/14/2023 11:13    Procedures Procedures   Medications Ordered in ED Medications -  No data to display  ED Course/ Medical Decision Making/ A&P   Medical Decision Making Amount and/or Complexity of Data Reviewed Radiology: ordered.   89 year old female presents for evaluation.  Please see HPI for further details.  On examination patient is afebrile and nontachycardic.  Her lung sounds are clear bilaterally, she is not hypoxic.  Abdomen is soft and compressible throughout.  Neurological examinations at baseline.  Patient does have soft tissue swelling to the right side of her forehead.  EOMs are intact and nonpainful.  She does have some left-sided  paracervical spinal tenderness but no centralized cervical spinal tenderness or step-off.  Will assess patient with CT head, cervical spine.  CT head unremarkable.  CT cervical spine unremarkable.  At this time, patient discharged home.  Patient advised to follow-up with her PCP.  Advise she can take Tylenol at home for headache.  Encouraged to return to the ED with any new or worsening symptoms and she voiced understanding.  Stable to discharge home.  Final Clinical Impression(s) / ED Diagnoses Final diagnoses:  Fall, initial encounter  Injury of head, initial encounter    Rx / DC Orders ED Discharge Orders     None         Clent Ridges 11/14/23 1140    Benjiman Core, MD 11/14/23 1459

## 2023-11-14 NOTE — Discharge Instructions (Signed)
 It was a pleasure taking part in your care.  As discussed, the CT scans done today of your head showed no evidence of any kind of intracranial abnormality.  Please take Tylenol at home for headaches.  Follow-up with your PCP.  Return to the ED with any new or worsening symptoms.

## 2023-11-14 NOTE — ED Triage Notes (Signed)
 Pt BIB EMS coming from church. Patient was had a fall as she coming down from the altar. States that she she thinks she missed a step and fell and hit right side of head. No on thinner, no LOC, Denies any CP or Shob, mild pain 2/10 to head. Hematoma present.   BP 146/84, Hr 90, Spo2 97%

## 2023-11-16 DIAGNOSIS — W19XXXD Unspecified fall, subsequent encounter: Secondary | ICD-10-CM | POA: Diagnosis not present

## 2023-11-16 DIAGNOSIS — S0990XD Unspecified injury of head, subsequent encounter: Secondary | ICD-10-CM | POA: Diagnosis not present

## 2023-11-16 DIAGNOSIS — W108XXD Fall (on) (from) other stairs and steps, subsequent encounter: Secondary | ICD-10-CM | POA: Diagnosis not present

## 2023-11-16 DIAGNOSIS — R42 Dizziness and giddiness: Secondary | ICD-10-CM | POA: Diagnosis not present

## 2023-11-21 ENCOUNTER — Other Ambulatory Visit: Payer: Self-pay

## 2023-11-21 ENCOUNTER — Encounter (HOSPITAL_COMMUNITY): Payer: Self-pay

## 2023-11-21 ENCOUNTER — Emergency Department (HOSPITAL_COMMUNITY)
Admission: EM | Admit: 2023-11-21 | Discharge: 2023-11-21 | Disposition: A | Discharge status: Home / Self Care | Attending: Emergency Medicine | Admitting: Emergency Medicine

## 2023-11-21 DIAGNOSIS — R002 Palpitations: Secondary | ICD-10-CM

## 2023-11-21 DIAGNOSIS — Z79899 Other long term (current) drug therapy: Secondary | ICD-10-CM | POA: Diagnosis not present

## 2023-11-21 DIAGNOSIS — I10 Essential (primary) hypertension: Secondary | ICD-10-CM | POA: Insufficient documentation

## 2023-11-21 DIAGNOSIS — R55 Syncope and collapse: Secondary | ICD-10-CM | POA: Diagnosis not present

## 2023-11-21 DIAGNOSIS — R42 Dizziness and giddiness: Secondary | ICD-10-CM | POA: Diagnosis not present

## 2023-11-21 LAB — BASIC METABOLIC PANEL
Anion gap: 9 (ref 5–15)
BUN: 17 mg/dL (ref 8–23)
CO2: 24 mmol/L (ref 22–32)
Calcium: 9.2 mg/dL (ref 8.9–10.3)
Chloride: 105 mmol/L (ref 98–111)
Creatinine, Ser: 0.61 mg/dL (ref 0.44–1.00)
GFR, Estimated: >60 mL/min (ref >60)
Glucose, Bld: 168 mg/dL — ABNORMAL HIGH (ref 70–99)
Potassium: 3.7 mmol/L (ref 3.5–5.1)
Sodium: 138 mmol/L (ref 135–145)

## 2023-11-21 LAB — CBC
HCT: 44 % (ref 36.0–46.0)
Hemoglobin: 14.1 g/dL (ref 12.0–15.0)
MCH: 29.2 pg (ref 26.0–34.0)
MCHC: 32 g/dL (ref 30.0–36.0)
MCV: 91.1 fL (ref 80.0–100.0)
Platelets: 167 10*3/uL (ref 150–400)
RBC: 4.83 MIL/uL (ref 3.87–5.11)
RDW: 15.8 % — ABNORMAL HIGH (ref 11.5–15.5)
WBC: 4.4 10*3/uL (ref 4.0–10.5)
nRBC: 0 % (ref 0.0–0.2)

## 2023-11-21 LAB — TROPONIN I (HIGH SENSITIVITY): Troponin I (High Sensitivity): 2 ng/L (ref <18)

## 2023-11-21 LAB — MAGNESIUM: Magnesium: 2.5 mg/dL — ABNORMAL HIGH (ref 1.7–2.4)

## 2023-11-21 LAB — CBG MONITORING, ED: Glucose-Capillary: 165 mg/dL — ABNORMAL HIGH (ref 70–99)

## 2023-11-21 LAB — RESP PANEL BY RT-PCR (RSV, FLU A&B, COVID)  RVPGX2
Influenza A by PCR: NEGATIVE
Influenza B by PCR: NEGATIVE
Resp Syncytial Virus by PCR: NEGATIVE
SARS Coronavirus 2 by RT PCR: NEGATIVE

## 2023-11-21 LAB — CK: Total CK: 97 U/L (ref 38–234)

## 2023-11-21 MED ORDER — MECLIZINE HCL 25 MG PO TABS
25.0000 mg | ORAL_TABLET | Freq: Once | ORAL | Status: AC
  Administered 2023-11-21: 25 mg via ORAL
  Filled 2023-11-21: qty 1

## 2023-11-21 MED ORDER — MECLIZINE HCL 25 MG PO TABS: 25.0000 mg | ORAL_TABLET | Freq: Three times a day (TID) | ORAL | 0 refills | Status: DC | PRN

## 2023-11-21 NOTE — ED Triage Notes (Signed)
 Dizziness that started 1 hour PTA, pt reports she is still dizzy. C/o epigastric pain. Closed head injury last week with negative CT

## 2023-11-21 NOTE — ED Provider Notes (Signed)
 [place] EMERGENCY DEPARTMENT AT [facility] Provider Note   CSN: [id number] Arrival date & time: 11/21/23  1743     History  Chief Complaint  Patient presents with   Dizziness    Sonya Gross is a 89 y.o. female.  HPI    89 year old female comes in with chief complaint of dizziness, weakness. Patient has a history of hyperlipidemia, aortic insufficiency, hypertension.  Patient states that she was feeling fine until suddenly she started feeling profoundly weak, dizzy and had some palpitations.  Dizziness is described as lightheadedness, unsteady and as if she was going to faint.  She thinks that maybe she had spinning sensation.  Patient did not have any chest pain, shortness of breath, but did have heartburn type feeling.  Patient denies any cough, runny nose, congestion, fevers, chills and denies any burning with urination.  Review of system is negative for any one-sided weakness, numbness, slurred speech, difficulty swallowing, difficulty breathing, difficulty with speech.   Home Medications Prior to Admission medications   Medication Sig Start Date End Date Taking? Authorizing Provider  alendronate (FOSAMAX) 70 MG tablet Take 70 mg by mouth once a week. 10/13/23  Yes [provider]  benzonatate (TESSALON) 200 MG capsule Take 200 mg by mouth 3 (three) times daily. 10/21/23  Yes [provider]  levobunolol (BETAGAN) 0.5 % ophthalmic solution 1 drop daily. 11/18/23  Yes [provider]  predniSONE (DELTASONE) 20 MG tablet Take 40 mg by mouth daily. 10/21/23  Yes [provider]  B Complex Vitamins (VITAMIN B COMPLEX) TABS Take 1 tablet by mouth See admin instructions.    [provider]  bimatoprost (LUMIGAN) 0.01 % SOLN Place 1 drop into the right eye at bedtime.    [provider]  Cholecalciferol (VITAMIN D3 PO) Take 1 tablet by mouth daily.    [provider]  gabapentin (NEURONTIN) 100 MG capsule  Take 1 capsule by mouth daily. 06/11/20   [provider]  hydrALAZINE (APRESOLINE) 25 MG tablet TAKE 1 TABLET(25 MG) BY MOUTH THREE TIMES DAILY 07/08/23   Yates Decamp, MD  levothyroxine (SYNTHROID) 25 MCG tablet Take 25 mcg by mouth daily before breakfast.     [provider]  Multiple Vitamins-Minerals (PRESERVISION/LUTEIN) CAPS Take 1 capsule by mouth daily.    [provider]  rosuvastatin (CRESTOR) 20 MG tablet Take 1 tablet by mouth daily.    [provider]      Allergies    Other, Penicillins, and Shellfish allergy    Review of Systems   Review of Systems  All other systems reviewed and are negative.   Physical Exam Updated Vital Signs BP 136/88   Pulse 98   Temp 97.8 F (36.6 C) (Oral)   Resp 16   Ht 5\' 6"  (1.676 m)   Wt 60.8 kg   SpO2 95%   BMI 21.63 kg/m  Physical Exam Vitals and nursing note reviewed.  Constitutional:      Appearance: She is well-developed.  HENT:     Head: Atraumatic.  Eyes:     Extraocular Movements: Extraocular movements intact.     Pupils: Pupils are equal, round, and reactive to light.     Comments: Visual fields intact  Cardiovascular:     Rate and Rhythm: Normal rate.  Pulmonary:     Effort: Pulmonary effort is normal.  Musculoskeletal:     Cervical back: Normal range of motion and neck supple.  Skin:    General: Skin  is warm and dry.  Neurological:     Mental Status: She is alert and oriented to person, place, and time.     Cranial Nerves: No cranial nerve deficit.     Sensory: No sensory deficit.     Motor: No weakness.     Coordination: Coordination normal.     Gait: Gait normal.     Comments: No dysmetria, patient was ambulated by me and was not unsteady     ED Results / Procedures / Treatments   Labs (all labs ordered are listed, but only abnormal results are displayed) Labs Reviewed  BASIC METABOLIC PANEL - Abnormal; Notable for the following components:      Result Value    Glucose, Bld 168 (*)    All other components within normal limits  MAGNESIUM - Abnormal; Notable for the following components:   Magnesium 2.5 (*)    All other components within normal limits  CBC - Abnormal; Notable for the following components:   RDW 15.8 (*)    All other components within normal limits  CBG MONITORING, ED - Abnormal; Notable for the following components:   Glucose-Capillary 165 (*)    All other components within normal limits  RESP PANEL BY RT-PCR (RSV, FLU A&B, COVID)  RVPGX2  CK  TROPONIN I (HIGH SENSITIVITY)    EKG EKG Interpretation Date/Time:  Sunday November 21 2023 18:34:52 EDT Ventricular Rate:  95 PR Interval:  150 QRS Duration:  138 QT Interval:  388 QTC Calculation: 488 R Axis:   -57  Text Interpretation: Sinus rhythm Left atrial enlargement Right bundle branch block Abnormal lateral Q waves No acute changes No significant change since last tracing Confirmed by Derwood Kaplan [phone]) on 11/21/2023 7:42:52 PM  Radiology No results found.  Procedures Procedures    Medications Ordered in ED Medications  meclizine (ANTIVERT) tablet 25 mg (25 mg Oral Given 11/21/23 2058)    ED Course/ Medical Decision Making/ A&P                                 Medical Decision Making Amount and/or Complexity of Data Reviewed Labs: ordered.    89 year old female comes in with chief complaint of weakness, dizziness. Patient however feels back to baseline normal now.  Symptoms arise suddenly and have resolved suddenly as well.  Patient had sudden onset of weakness and dizziness, with recent history of closed head injury.  Patient also has history of hypertension and hyperlipidemia.  Collateral history also provided patient daughter, was at the bedside.  I also reviewed patient's records including her medications and recent ED visit and cardiology notes.  Differential diagnosis for this patient's sudden onset weakness, dizziness includes arrhythmia, TIA/stroke,  ACS, orthostatic dizziness, dehydration, electrolyte abnormality.  Plan is to get basic labs and monitor the patient.  We will get single troponin as well.  Reassessment: At 8:45 PM patient was reassessed.  Her initial workup including metabolic profile, troponin, CBC is reassuring.  I reviewed patient's cardiac/telemetry monitoring.  Patient has not had any arrhythmia.  I ambulated the patient myself, and she did well, there was no ataxia or near fainting. Orthostatic vital signs are also reassuring.  Plan is to get flu/COVID test.  Will give patient some Antivert.  P.o. challenge initiated. Anticipate discharge at this point.  Patient continues to feel much better than she did at home.  She lives by herself.  She is comfortable going home.  10:26 PM The patient appears reasonably screened and/or stabilized for discharge and I doubt any other medical condition or other [facility] requiring further screening, evaluation, or treatment in the ED at this time prior to discharge.   Results from the ER workup discussed with the patient face to face and all questions answered to the best of my ability. The patient is safe for discharge with strict return precautions.   Final Clinical Impression(s) / ED Diagnoses Final diagnoses:  Dizziness  Near syncope  Palpitations    Rx / DC Orders ED Discharge Orders          Ordered    Amb referral to [facility]  Status:  Canceled        11/21/23 1827    Ambulatory referral to Cardiology       Comments: If you have not heard from the Cardiology office within the next 72 hours please call [phone].   11/21/23 2224              Derwood Kaplan, MD 11/21/23 2226

## 2023-11-21 NOTE — ED Notes (Signed)
PT ambulated to bathroom with minimal assistance

## 2023-11-21 NOTE — Discharge Instructions (Addendum)
 You were seen in the ER for dizziness, feeling weak and near fainting.  The workup in the emergency room including cardiac monitoring, labs are reassuring.  Your flu test is negative.  Please hydrate well.  Follow-up with cardiologist, we have placed cardiology referral.   Please return to the ER if your symptoms worsen; you have increased pain, fevers, chills, inability to keep any medications down, confusion.

## 2023-11-26 ENCOUNTER — Ambulatory Visit (INDEPENDENT_AMBULATORY_CARE_PROVIDER_SITE_OTHER): Payer: Medicare PPO

## 2023-12-13 NOTE — Progress Notes (Signed)
 Cardiology Office Note:    Date:  12/14/2023  ID:  Sonya Gross, DOB 12/30/1929, MRN [id number] PCP: Aliene Beams, MD  [facility] Providers Cardiologist:  Yates Decamp, MD       Patient Profile:      Aortic insufficiency TTE 02/25/2023: EF 65, GR 1 DD, mild AI, mild TR Bifascicular block ETT 07/29/15: no ischemia; 96% of MPHR achieved Monitor 03/2019: NSR Hypertension  Hyperlipidemia  Carotid artery stenosis  Carotid US 09/01/21: bilat ICA 1-39         Discussed the use of AI scribe software for clinical note transcription with the patient, who gave verbal consent to proceed.  History of Present Illness Sonya Gross is a 89 y.o. female who returns for follow up after a visit to the ED with near syncope. She was recently seen in the ED 11/14/23 for a fall. Notes indicate she tripped. Head and neck CT were negative. She was seen in the ED 11/21/2023 for symptoms of near syncope.  EKG was personally reviewed and demonstrated sinus rhythm with left anterior fascicular block and right bundle branch block.  This was compared to previous EKG from 2023 and there is no significant change found.  Troponin was negative x 1.  Hemoglobin and creatinine were both normal.  During this episode of near syncope, she felt 'real sick all over' and lightheaded while ambulating postprandially. She did not completely lose consciousness but experienced rapid palpitations.  She has not had a recurrence. No chest discomfort, heaviness, pressure, or tightness. No shortness of breath, leg swelling, or orthopnea. She reports occasional fullness in her right ear upon standing, but no vertigo or syncope.  This has been ongoing for several years (2020) and she has seen several specialists. She lives alone and independently manages her household chores, including cleaning, vacuuming, and sweeping.  She has not experienced any exercise intolerance.  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  Review of Systems  Gastrointestinal:  Negative for hematochezia and melena.  Genitourinary:  Negative for hematuria.  -See HPI     Studies Reviewed:       Results LABS Total cholesterol: 175 (03/24/2023) HDL: 52 (03/24/2023) LDL: 865 (03/24/2023) Triglyceride: 62 (03/24/2023) Potassium: 4.4 (12/03/2023) ALT: 14 (12/03/2023) Creatinine: 0.61 (11/21/2023) Hemoglobin: 14.1 (11/21/2023)   Risk Assessment/Calculations:     HYPERTENSION CONTROL Vitals:   12/14/23 0926 12/14/23 0951  BP: (!) 116/58 (!) 144/80    The patient's blood pressure is elevated above target today.  In order to address the patient's elevated BP: Blood pressure will be monitored at home to determine if medication changes need to be made.          Physical Exam:   VS:  BP (!) 144/80   Pulse 70   Ht 5\' 6"  (1.676 m)   Wt 133 lb 12.8 oz (60.7 kg)   SpO2 93%   BMI 21.60 kg/m    Wt Readings from Last 3 Encounters:  12/14/23 133 lb 12.8 oz (60.7 kg)  11/21/23 134 lb (60.8 kg)  11/14/23 133 lb (60.3 kg)    Constitutional:      Appearance: Healthy appearance. Not in distress.  Neck:     Vascular: JVD normal.  Pulmonary:     Breath sounds: Normal breath sounds. No wheezing. No rales.  Cardiovascular:     Normal rate. Regular rhythm.     Murmurs: There is no murmur.  Edema:  Peripheral edema absent.  Abdominal:     Palpations: Abdomen is soft.        Assessment and Plan:   Assessment & Plan Near syncope She experienced an episode of near syncope, prompting an emergency room visit. She has known bifascicular block with right bundle branch block and left anterior fascicular block, increasing her risk for high-grade heart block, pauses and symptomatic bradycardia. The EKG in the ED was unchanged from previous EKGs.   - Arrange 30-day event monitor - She already has follow up with Dr. Jacinto Halim in late May. She can keep this appt. Primary hypertension Her blood pressure  is borderline elevated at 144/80 mmHg. Due to recent near syncope, I will not make any adjustments in her medications today.   - Continue hydralazine 25 mg three times a day. - Monitor blood pressure and notify if systolic BP is 150 mmHg or higher. Nonrheumatic aortic (valve) insufficiency She has mild aortic insufficiency with a normal ejection fraction as per echocardiogram from June 2024.   - Consider repeat echocardiogram in 2 to 3 years. Bilateral carotid artery stenosis She has mild carotid artery stenosis with bilateral ICA stenosis of 1-39% by ultrasound in December 2022.  . - Continue aspirin 81 mg daily.      Dispo:  Return in 8 weeks (on 02/09/2024) for Scheduled Follow Up, w/ Dr. Jacinto Halim.  Signed, Tereso Newcomer, PA-C

## 2023-12-14 ENCOUNTER — Ambulatory Visit: Attending: Physician Assistant | Admitting: Physician Assistant

## 2023-12-14 ENCOUNTER — Encounter: Payer: Self-pay | Admitting: Physician Assistant

## 2023-12-14 VITALS — BP 144/80 | HR 70 | Ht 66.0 in | Wt 133.8 lb

## 2023-12-14 DIAGNOSIS — I6523 Occlusion and stenosis of bilateral carotid arteries: Secondary | ICD-10-CM | POA: Diagnosis not present

## 2023-12-14 DIAGNOSIS — R55 Syncope and collapse: Secondary | ICD-10-CM | POA: Diagnosis not present

## 2023-12-14 DIAGNOSIS — I351 Nonrheumatic aortic (valve) insufficiency: Secondary | ICD-10-CM | POA: Diagnosis not present

## 2023-12-14 DIAGNOSIS — I10 Essential (primary) hypertension: Secondary | ICD-10-CM | POA: Diagnosis not present

## 2023-12-14 DIAGNOSIS — I452 Bifascicular block: Secondary | ICD-10-CM

## 2023-12-14 NOTE — Patient Instructions (Signed)
 Medication Instructions:  Your physician recommends that you continue on your current medications as directed. Please refer to the Current Medication list given to you today. None  *If you need a refill on your cardiac medications before your next appointment, please call your pharmacy*  Lab Work: None ordered  If you have labs (blood work) drawn today and your tests are completely normal, you will receive your results only by: MyChart Message (if you have MyChart) OR A paper copy in the mail If you have any lab test that is abnormal or we need to change your treatment, we will call you to review the results.  Testing/Procedures: [employer] Cardiac Event Monitor Instructions  Your physician has requested you wear your cardiac event monitor for _____ days, (1-30). [employer] may call or text to confirm a shipping address. The monitor will be sent to a land address via UPS. [employer] will not ship a monitor to a PO BOX. It typically takes 3-5 days to receive your monitor after it has been enrolled. [employer] will assist with USPS tracking if your package is delayed. The telephone number for [employer] is [phone]. Once you have received your monitor, please review the enclosed instructions. Instruction tutorials can also be viewed under help and settings on the enclosed cell phone. Your monitor has already been registered assigning a specific monitor serial # to you.  Billing and Self Pay Discount Information  [employer] has been provided the insurance information we had on file for you.  If your insurance has been updated, please call [employer] at [phone] to provide them with your updated insurance information.   [employer] offers a discounted Self Pay option for patients who have insurance that does not cover their cardiac event monitor or patients without insurance.  The discounted cost of a Self Pay Cardiac Event Monitor would be $225.00 , if the patient contacts  [employer] at [phone] within 7 days of applying the monitor to make payment arrangements.  If the patient does not contact [employer] within 7 days of applying the monitor, the cost of the cardiac event monitor will be $350.00.  Applying the monitor  Remove cell phone from case and turn it on. The cell phone works as IT consultant and needs to be within [employer] of you at all times. The cell phone will need to be charged on a daily basis. We recommend you plug the cell phone into the enclosed charger at your bedside table every night.  Monitor batteries: You will receive two monitor batteries labelled #1 and #2. These are your recorders. Plug battery #2 onto the second connection on the enclosed charger. Keep one battery on the charger at all times. This will keep the monitor battery deactivated. It will also keep it fully charged for when you need to switch your monitor batteries. A small light will be blinking on the battery emblem when it is charging. The light on the battery emblem will remain on when the battery is fully charged.  Open package of a Monitor strip. Insert battery #1 into black hood on strip and gently squeeze monitor battery onto connection as indicated in instruction booklet. Set aside while preparing skin.  Choose location for your strip, vertical or horizontal, as indicated in the instruction booklet. Shave to remove all hair from location. There cannot be any lotions, oils, powders, or colognes on skin where monitor is to be applied. Wipe skin clean with enclosed Saline wipe. Dry skin completely.  Peel paper labeled #1 off the back  of the Monitor strip exposing the adhesive. Place the monitor on the chest in the vertical or horizontal position shown in the instruction booklet. One arrow on the monitor strip must be pointing upward. Carefully remove paper labeled #2, attaching remainder of strip to your skin. Try not to create any folds or wrinkles in the strip  as you apply it.  Firmly press and release the circle in the center of the monitor battery. You will hear a small beep. This is turning the monitor battery on. The heart emblem on the monitor battery will light up every 5 seconds if the monitor battery in turned on and connected to the patient securely. Do not push and hold the circle down as this turns the monitor battery off. The cell phone will locate the monitor battery. A screen will appear on the cell phone checking the connection of your monitor strip. This may read poor connection initially but change to good connection within the next minute. Once your monitor accepts the connection you will hear a series of 3 beeps followed by a climbing crescendo of beeps. A screen will appear on the cell phone showing the two monitor strip placement options. Touch the picture that demonstrates where you applied the monitor strip.  Your monitor strip and battery are waterproof. You are able to shower, bathe, or swim with the monitor on. They just ask you do not submerge deeper than 3 feet underwater. We recommend removing the monitor if you are swimming in a lake, river, or ocean.  Your monitor battery will need to be switched to a fully charged monitor battery approximately once a week. The cell phone will alert you of an action which needs to be made.  On the cell phone, tap for details to reveal connection status, monitor battery status, and cell phone battery status. The green dots indicates your monitor is in good status. A red dot indicates there is something that needs your attention.  To record a symptom, click the circle on the monitor battery. In 30-60 seconds a list of symptoms will appear on the cell phone. Select your symptom and tap save. Your monitor will record a sustained or significant arrhythmia regardless of you clicking the button. Some patients do not feel the heart rhythm irregularities. [employer] will notify us of any  serious or critical events.  Refer to instruction booklet for instructions on switching batteries, changing strips, the Do not disturb or Pause features, or any additional questions.  Call [employer] at [phone], to confirm your monitor is transmitting and record your baseline. They will answer any questions you may have regarding the monitor instructions at that time.  Returning the monitor to [employer]  Place all equipment back into blue box. Peel off strip of paper to expose adhesive and close box securely. There is a prepaid UPS shipping label on this box. Drop in a UPS drop box, or at a UPS facility like [employer]. You may also contact [employer] to arrange UPS to pick up monitor package at your home.   Follow-Up: At [facility], you and your health needs are our priority.  As part of our continuing mission to provide you with exceptional heart care, our providers are all part of one team.  This team includes your primary Cardiologist (physician) and Advanced Practice Providers or APPs (Physician Assistants and Nurse Practitioners) who all work together to provide you with the care you need, when you need it.  Your next appointment:   Already scheduled  Provider:   Yates Decamp, MD     We recommend signing up for the patient portal called "MyChart".  Sign up information is provided on this After Visit Summary.  MyChart is used to connect with patients for Virtual Visits (Telemedicine).  Patients are able to view lab/test results, encounter notes, upcoming appointments, etc.  Non-urgent messages can be sent to your provider as well.   To learn more about what you can do with MyChart, go to [email or web address].   Other Instructions Keep an eye on your blood pressure and let us know if it is running 150 or higher      1st Floor: - Lobby - Registration  - Pharmacy  - Lab - Cafe  2nd Floor: - PV Lab - Diagnostic Testing (echo, CT, nuclear med)  3rd Floor: -  Vacant  4th Floor: - TCTS (cardiothoracic surgery) - AFib Clinic - Structural Heart Clinic - Vascular Surgery  - Vascular Ultrasound  5th Floor: - HeartCare Cardiology (general and EP) - Clinical Pharmacy for coumadin, hypertension, lipid, weight-loss medications, and med management appointments    Valet parking services will be available as well.

## 2023-12-14 NOTE — Assessment & Plan Note (Signed)
 She has mild aortic insufficiency with a normal ejection fraction as per echocardiogram from June 2024.   - Consider repeat echocardiogram in 2 to 3 years.

## 2023-12-14 NOTE — Assessment & Plan Note (Signed)
 Her blood pressure is borderline elevated at 144/80 mmHg. Due to recent near syncope, I will not make any adjustments in her medications today.   - Continue hydralazine 25 mg three times a day. - Monitor blood pressure and notify if systolic BP is 150 mmHg or higher.

## 2023-12-16 DIAGNOSIS — H35033 Hypertensive retinopathy, bilateral: Secondary | ICD-10-CM | POA: Diagnosis not present

## 2023-12-16 DIAGNOSIS — H401131 Primary open-angle glaucoma, bilateral, mild stage: Secondary | ICD-10-CM | POA: Diagnosis not present

## 2023-12-16 DIAGNOSIS — H43813 Vitreous degeneration, bilateral: Secondary | ICD-10-CM | POA: Diagnosis not present

## 2023-12-17 DIAGNOSIS — R7303 Prediabetes: Secondary | ICD-10-CM | POA: Diagnosis not present

## 2023-12-17 DIAGNOSIS — R053 Chronic cough: Secondary | ICD-10-CM | POA: Diagnosis not present

## 2023-12-17 DIAGNOSIS — R0981 Nasal congestion: Secondary | ICD-10-CM | POA: Diagnosis not present

## 2023-12-17 DIAGNOSIS — I10 Essential (primary) hypertension: Secondary | ICD-10-CM | POA: Diagnosis not present

## 2023-12-27 DIAGNOSIS — I452 Bifascicular block: Secondary | ICD-10-CM | POA: Diagnosis not present

## 2023-12-27 DIAGNOSIS — R55 Syncope and collapse: Secondary | ICD-10-CM | POA: Diagnosis not present

## 2024-01-13 ENCOUNTER — Ambulatory Visit: Payer: Medicare PPO | Admitting: Cardiology

## 2024-01-31 ENCOUNTER — Ambulatory Visit: Attending: Physician Assistant

## 2024-01-31 DIAGNOSIS — I452 Bifascicular block: Secondary | ICD-10-CM

## 2024-01-31 DIAGNOSIS — R55 Syncope and collapse: Secondary | ICD-10-CM

## 2024-02-01 DIAGNOSIS — R55 Syncope and collapse: Secondary | ICD-10-CM | POA: Diagnosis not present

## 2024-02-01 DIAGNOSIS — I452 Bifascicular block: Secondary | ICD-10-CM

## 2024-02-02 ENCOUNTER — Ambulatory Visit: Payer: Self-pay | Admitting: Physician Assistant

## 2024-02-02 DIAGNOSIS — I452 Bifascicular block: Secondary | ICD-10-CM

## 2024-02-09 ENCOUNTER — Encounter: Payer: Self-pay | Admitting: Cardiology

## 2024-02-09 ENCOUNTER — Ambulatory Visit: Attending: Cardiology | Admitting: Cardiology

## 2024-02-09 VITALS — BP 114/62 | HR 64 | Ht 66.0 in | Wt 134.4 lb

## 2024-02-09 DIAGNOSIS — I10 Essential (primary) hypertension: Secondary | ICD-10-CM

## 2024-02-09 DIAGNOSIS — I351 Nonrheumatic aortic (valve) insufficiency: Secondary | ICD-10-CM

## 2024-02-09 DIAGNOSIS — R55 Syncope and collapse: Secondary | ICD-10-CM | POA: Diagnosis not present

## 2024-02-09 DIAGNOSIS — I452 Bifascicular block: Secondary | ICD-10-CM | POA: Diagnosis not present

## 2024-02-09 NOTE — Patient Instructions (Signed)
 Medication Instructions:  Your physician has recommended you make the following change in your medication:  Change hydralazine  to 25 mg by mouth twice daily  *If you need a refill on your cardiac medications before your next appointment, please call your pharmacy*  Lab Work: none If you have labs (blood work) drawn today and your tests are completely normal, you will receive your results only by: MyChart Message (if you have MyChart) OR A paper copy in the mail If you have any lab test that is abnormal or we need to change your treatment, we will call you to review the results.  Testing/Procedures: none  Follow-Up: At [facility], you and your health needs are our priority.  As part of our continuing mission to provide you with exceptional heart care, our providers are all part of one team.  This team includes your primary Cardiologist (physician) and Advanced Practice Providers or APPs (Physician Assistants and Nurse Practitioners) who all work together to provide you with the care you need, when you need it.  Your next appointment:   As needed  Provider:   Knox Perl, MD    We recommend signing up for the patient portal called "MyChart".  Sign up information is provided on this After Visit Summary.  MyChart is used to connect with patients for Virtual Visits (Telemedicine).  Patients are able to view lab/test results, encounter notes, upcoming appointments, etc.  Non-urgent messages can be sent to your provider as well.   To learn more about what you can do with MyChart, go to [email or web address].   Other Instructions

## 2024-02-09 NOTE — Progress Notes (Signed)
 Cardiology Office Note:  .   Date:  02/09/2024  ID:  Sonya Gross, DOB 04/17/1930, MRN [id number] PCP: Dorena Gander, MD  [facility] Providers Cardiologist:  Knox Perl, MD   History of Present Illness: .   Sonya Gross is a 89 y.o. pleasant African-American female with mild to moderate aortic regurgitation, bifascicular block on the EKG, hypertension, mixed hyperlipidemia presents for follow-up of bifascicular block and aortic regurgitation.  Echocardiogram in June 24 it revealed normal LVEF, grade 1 diastolic dysfunction with mild aortic regurgitation and she has had a remote treadmill stress test in 2016 which was nonischemic.    She had presented to the emergency department with near syncope on 11/21/2023 and in view of her underlying conduction system disease and advanced age, underwent extended EKG monitoring and presents for follow-up.  Discussed the use of AI scribe software for clinical note transcription with the patient, who gave verbal consent to proceed.  History of Present Illness Sonya Gross is a 89 year old female who presents with a near syncope episode. On November 21, 2023, she experienced a sudden onset of feeling extremely sick and as if she was going to pass out while walking around her house. She did not lose consciousness and felt better by the time she arrived at the emergency room. No similar episodes have occurred since then.  During the episode, she felt her head 'swimming' and terribly sick, prompting her to sit down immediately. She did not experience nausea and had eaten about an hour prior.    Labs    Lab Results  Component Value Date   NA 138 11/21/2023   K 3.7 11/21/2023   CO2 24 11/21/2023   GLUCOSE 168 (H) 11/21/2023   BUN 17 11/21/2023   CREATININE 0.61 11/21/2023   CALCIUM 9.2 11/21/2023   GFRNONAA >60 11/21/2023      Latest Ref Rng & Units 11/21/2023    6:27 PM 10/10/2023    1:27 PM 05/01/2023   10:36 AM  BMP  Glucose 70 - 99  mg/dL 782  956  213   BUN 8 - 23 mg/dL 17  15  13    Creatinine 0.44 - 1.00 mg/dL 0.86  5.78  4.69   Sodium 135 - 145 mmol/L 138  141  138   Potassium 3.5 - 5.1 mmol/L 3.7  4.8  3.7   Chloride 98 - 111 mmol/L 105  107  104   CO2 22 - 32 mmol/L 24  26  26    Calcium 8.9 - 10.3 mg/dL 9.2  9.5  9.1       Latest Ref Rng & Units 11/21/2023    6:27 PM 10/10/2023    1:27 PM 05/01/2023   10:36 AM  CBC  WBC 4.0 - 10.5 K/uL 4.4  5.0  3.8   Hemoglobin 12.0 - 15.0 g/dL 62.9  52.8  41.3   Hematocrit 36.0 - 46.0 % 44.0  43.7  44.9   Platelets 150 - 400 K/uL 167  196  145    No results found for: "HGBA1C"  Lab Results  Component Value Date   TSH 4.554 (H) 05/01/2023     ROS  Review of Systems  Cardiovascular:  Negative for chest pain, dyspnea on exertion and leg swelling.    Physical Exam:   VS:  BP 114/62   Pulse 64   Ht 5\' 6"  (1.676 m)   Wt 134 lb 6.4 oz (61 kg)   SpO2 94%  BMI 21.69 kg/m    Wt Readings from Last 3 Encounters:  02/09/24 134 lb 6.4 oz (61 kg)  12/14/23 133 lb 12.8 oz (60.7 kg)  11/21/23 134 lb (60.8 kg)    Physical Exam Neck:     Vascular: No carotid bruit or JVD.  Cardiovascular:     Rate and Rhythm: Normal rate and regular rhythm.     Pulses: Intact distal pulses.     Heart sounds: Normal heart sounds. No murmur heard.    No gallop.  Pulmonary:     Effort: Pulmonary effort is normal.     Breath sounds: Normal breath sounds.  Abdominal:     General: Bowel sounds are normal.     Palpations: Abdomen is soft.  Musculoskeletal:     Right lower leg: No edema.     Left lower leg: No edema.    Studies Reviewed: Aaron Aas    Extended outpatient EKG monitoring 30 days starting 12/27/2023 for syncope. Predominant rhythm is normal sinus rhythm.  Minimum heart rate 60 bpm and maximum heart rate 128 bpm with average heartbeat of 80 bpm. Rare PACs and PVCs. No atrial fibrillation, no heart block.  No symptoms reported.  Unremarkable transmission. EKG:       EKG  11/21/2023: Normal sinus rhythm at rate of 95 bpm, left anterior fascicular block.  Right bundle branch block.  Bifascicular block.  Cannot exclude lateral infarct old.  Compared to 05/01/2023, no change.  ASSESSMENT AND PLAN: .      ICD-10-CM   1. Near syncope  R55     2. Bifascicular block  I45.2     3. Primary hypertension  I10 hydrALAZINE  (APRESOLINE ) 25 MG tablet    4. Moderate aortic regurgitation  I35.1       Assessment and Plan Assessment & Plan Bifascicular block Bifascicular block identified on EKG with no current indication for pacemaker as the heart monitor did not show concerning findings.  Aortic valve regurgitation Aortic valve regurgitation present but not contributing to current symptoms. No signs of heart failure or other complications. Unless new dyspnea or CHF symptoms no further evaluation needed.   Hypertension Blood pressure slightly low, with readings around 114/62 mmHg, considered soft for her age. Hydralazine  dosage adjusted from 25 mg 3 times daily to 25 mg twice daily.  Emphasized the importance of immediate action if feeling faint to prevent falls, including immediately sitting down or laying down, Valsalva maneuver/counterpressure maneuver, keeping herself well-hydrated and fall precautions. - Reduce hydralazine  dosage from 25 mg three times a day to twice a day. - Advise to maintain adequate hydration. - Instruct to lay down immediately if feeling faint and have a phone nearby to call for help if needed. As she is stable from cardiac standpoint, I will see her back on a as needed basis. Signed,  Knox Perl, MD, [facility] 02/09/2024, 3:39 PM [facility] [address] [place], [place] [address] Phone: [phone]. Fax:  [phone]

## 2024-02-25 DIAGNOSIS — Z0184 Encounter for antibody response examination: Secondary | ICD-10-CM | POA: Diagnosis not present

## 2024-03-13 DIAGNOSIS — J0110 Acute frontal sinusitis, unspecified: Secondary | ICD-10-CM | POA: Diagnosis not present

## 2024-03-13 DIAGNOSIS — H66002 Acute suppurative otitis media without spontaneous rupture of ear drum, left ear: Secondary | ICD-10-CM | POA: Diagnosis not present

## 2024-03-13 DIAGNOSIS — Z1231 Encounter for screening mammogram for malignant neoplasm of breast: Secondary | ICD-10-CM | POA: Diagnosis not present

## 2024-04-18 DIAGNOSIS — R7303 Prediabetes: Secondary | ICD-10-CM | POA: Diagnosis not present

## 2024-04-18 DIAGNOSIS — E039 Hypothyroidism, unspecified: Secondary | ICD-10-CM | POA: Diagnosis not present

## 2024-04-18 DIAGNOSIS — J3089 Other allergic rhinitis: Secondary | ICD-10-CM | POA: Diagnosis not present

## 2024-04-18 DIAGNOSIS — Z0000 Encounter for general adult medical examination without abnormal findings: Secondary | ICD-10-CM | POA: Diagnosis not present

## 2024-04-18 DIAGNOSIS — I10 Essential (primary) hypertension: Secondary | ICD-10-CM | POA: Diagnosis not present

## 2024-04-18 DIAGNOSIS — M79606 Pain in leg, unspecified: Secondary | ICD-10-CM | POA: Diagnosis not present

## 2024-04-18 DIAGNOSIS — E7800 Pure hypercholesterolemia, unspecified: Secondary | ICD-10-CM | POA: Diagnosis not present

## 2024-04-18 DIAGNOSIS — I700 Atherosclerosis of aorta: Secondary | ICD-10-CM | POA: Diagnosis not present

## 2024-04-18 DIAGNOSIS — M810 Age-related osteoporosis without current pathological fracture: Secondary | ICD-10-CM | POA: Diagnosis not present

## 2024-04-20 DIAGNOSIS — H35033 Hypertensive retinopathy, bilateral: Secondary | ICD-10-CM | POA: Diagnosis not present

## 2024-04-20 DIAGNOSIS — H43813 Vitreous degeneration, bilateral: Secondary | ICD-10-CM | POA: Diagnosis not present

## 2024-04-20 DIAGNOSIS — H401131 Primary open-angle glaucoma, bilateral, mild stage: Secondary | ICD-10-CM | POA: Diagnosis not present

## 2024-04-20 DIAGNOSIS — H5213 Myopia, bilateral: Secondary | ICD-10-CM | POA: Diagnosis not present

## 2024-06-13 NOTE — Progress Notes (Signed)
 New Patient Pulmonology Office Visit   Subjective:  Patient ID: Sonya Gross, female    DOB: April 02, 1930  MRN: [id number]  Referred by: Rolinda Millman, MD  CC: No chief complaint on file.   HPI Sonya Gross is a 89 y.o. female with ***   {PULM QUESTIONNAIRES (Optional):33196}  ROS  Allergies: Other, Penicillins, and Shellfish allergy  Current Outpatient Medications:    alendronate (FOSAMAX) 70 MG tablet, Take 70 mg by mouth once a week., Disp: , Rfl:    aspirin EC 81 MG tablet, Take 81 mg by mouth daily., Disp: , Rfl:    B Complex Vitamins (VITAMIN B COMPLEX) TABS, Take 1 tablet by mouth See admin instructions., Disp: , Rfl:    bimatoprost (LUMIGAN) 0.01 % SOLN, Place 1 drop into the right eye at bedtime., Disp: , Rfl:    Cholecalciferol (VITAMIN D3 PO), Take 1 tablet by mouth daily., Disp: , Rfl:    gabapentin (NEURONTIN) 100 MG capsule, Take 100 mg by mouth 3 (three) times daily., Disp: , Rfl:    hydrALAZINE  (APRESOLINE ) 25 MG tablet, Take 1 tablet (25 mg total) by mouth in the morning and at bedtime., Disp: , Rfl:    levobunolol (BETAGAN) 0.5 % ophthalmic solution, 1 drop daily., Disp: , Rfl:    levothyroxine (SYNTHROID) 25 MCG tablet, Take 25 mcg by mouth daily before breakfast. , Disp: , Rfl:    Multiple Vitamins-Minerals (PRESERVISION/LUTEIN) CAPS, Take 1 capsule by mouth daily., Disp: , Rfl:    rosuvastatin (CRESTOR) 20 MG tablet, Take 1 tablet by mouth daily., Disp: , Rfl:  Past Medical History:  Diagnosis Date   Abnormal glucose    Bifascicular block 04/03/2019   Cataract    Dizziness    Dizziness    Hot flashes    HTN (hypertension) 04/03/2019   Hyperlipidemia    Multiple nodules of lung    Nonrheumatic aortic (valve) insufficiency 04/03/2019   Osteopenia    Palpitations    Thyroid  disease    Vitamin D deficiency    Past Surgical History:  Procedure Laterality Date   ABDOMINAL HYSTERECTOMY     APPENDECTOMY  1975   CATARACT EXTRACTION, BILATERAL   2010   CHOLECYSTECTOMY  2003   Family History  Problem Relation Age of Onset   Uterine cancer Mother        died at age 50   Healthy Father        died at age 27   Mental illness Sister    Social History   Socioeconomic History   Marital status: Widowed    Spouse name: Not on file   Number of children: 2   Years of education: college - masters in biology   Highest education level: Master's degree (e.g., MA, MS, MEng, MEd, MSW, MBA)  Occupational History   Occupation: Retired   Tobacco Use   Smoking status: Never   Smokeless tobacco: Never  Vaping Use   Vaping status: Never Used  Substance and Sexual Activity   Alcohol use: Never    Alcohol/week: 0.0 standard drinks of alcohol   Drug use: Never   Sexual activity: Not on file  Other Topics Concern   Not on file  Social History Narrative   Lives alone.   One cup caffeine use.   Right-handed.   Social Drivers of Corporate investment banker Strain: Not on file  Food Insecurity: Not on file  Transportation Needs: Not on file  Physical Activity: Not on file  Stress: Not on file  Social Connections: Not on file  Intimate Partner Violence: Not on file       Objective:  There were no vitals taken for this visit. {Pulm Vitals (Optional):32837}  Physical Exam  Diagnostic Review:  {Labs (Optional):32838}     Images: 09/2023 CXR normal  04/2023 CT chest IMPRESSION: 1. No acute cardiopulmonary disease. 2. Multiple bilateral pulmonary nodules unchanged compared with the prior examination of 01/20/2016. 3.  Aortic Atherosclerosis   Assessment & Plan:   Assessment & Plan    No follow-ups on file.    Marny Patch, MD Pulmonary and Critical Care Medicine [facility]

## 2024-06-14 ENCOUNTER — Ambulatory Visit (INDEPENDENT_AMBULATORY_CARE_PROVIDER_SITE_OTHER)

## 2024-06-14 VITALS — BP 142/70 | HR 73 | Temp 97.9°F | Ht 66.0 in | Wt 127.8 lb

## 2024-06-14 DIAGNOSIS — R053 Chronic cough: Secondary | ICD-10-CM

## 2024-06-14 MED ORDER — BENZONATATE 100 MG PO CAPS: 100.0000 mg | ORAL_CAPSULE | Freq: Three times a day (TID) | ORAL | 0 refills | Status: AC | PRN

## 2024-06-14 MED ORDER — GUAIFENESIN ER 600 MG PO TB12: 600.0000 mg | ORAL_TABLET | Freq: Two times a day (BID) | ORAL | 0 refills | Status: AC

## 2024-06-14 NOTE — Patient Instructions (Signed)
 Dear Ms. Sonya Gross,   You have cough after a viral infection in December. There are different causes of cough as sinus drainage, acid reflux or asthma, however you denied any other symptoms. I will try to treat the cough with 2 medications: -Mucinex  twice a day for 10 days -Tessalon  pearls 3 times a day as need it for cough.  I will see you in 2 months.

## 2024-07-20 DIAGNOSIS — H5213 Myopia, bilateral: Secondary | ICD-10-CM | POA: Diagnosis not present

## 2024-07-20 DIAGNOSIS — H401131 Primary open-angle glaucoma, bilateral, mild stage: Secondary | ICD-10-CM | POA: Diagnosis not present

## 2024-07-20 DIAGNOSIS — H35033 Hypertensive retinopathy, bilateral: Secondary | ICD-10-CM | POA: Diagnosis not present

## 2024-07-20 DIAGNOSIS — H43813 Vitreous degeneration, bilateral: Secondary | ICD-10-CM | POA: Diagnosis not present

## 2024-08-20 NOTE — Progress Notes (Signed)
 New Patient Pulmonology Office Visit   Subjective:  Patient ID: Sonya Gross, female    DOB: 08/29/1930  MRN: [id number]  Referred by: Rolinda Millman, MD  CC:  No chief complaint on file.   HPI Sonya Gross is a 89 y.o. female without pulmonary disease, who came for evaluation of chronic cough. Patient started having cough since Christmas 2024, after a flu. Since then, she has a persistent dry cough and sneezing. She is not able to identify any possible triggers, and there is not recent changes in her environment.  She denied dyspnea on exertion, wheezing, mucus production but she has runny nose. She had a CT chest in 2024  showing multiple bilateral pulmonary nodules unchanged compared to 2017. Xray in 2025, showed flattening of the diaphragms and moderate hyperinflation, unchanged, and Small nodular density within the lateral right mid lung corresponds to a superior segment of the right lower lobe nodule that was decreased in size and 05/01/2023 compared to 01/20/2016 CT and benign. She does not have smoking history. Denied postnasal drip, GERD.   ROS as above  Allergies: Other, Penicillins, and Shellfish allergy  Current Outpatient Medications:    alendronate (FOSAMAX) 70 MG tablet, Take 70 mg by mouth once a week., Disp: , Rfl:    aspirin EC 81 MG tablet, Take 81 mg by mouth daily., Disp: , Rfl:    B Complex Vitamins (VITAMIN B COMPLEX) TABS, Take 1 tablet by mouth See admin instructions., Disp: , Rfl:    bimatoprost (LUMIGAN) 0.01 % SOLN, Place 1 drop into the right eye at bedtime., Disp: , Rfl:    Cholecalciferol (VITAMIN D3 PO), Take 1 tablet by mouth daily., Disp: , Rfl:    gabapentin (NEURONTIN) 100 MG capsule, Take 100 mg by mouth 3 (three) times daily., Disp: , Rfl:    hydrALAZINE  (APRESOLINE ) 25 MG tablet, Take 1 tablet (25 mg total) by mouth in the morning and at bedtime., Disp: , Rfl:    levobunolol (BETAGAN) 0.5 % ophthalmic solution, 1 drop daily., Disp: , Rfl:     levothyroxine (SYNTHROID) 25 MCG tablet, Take 25 mcg by mouth daily before breakfast. , Disp: , Rfl:    Multiple Vitamins-Minerals (PRESERVISION/LUTEIN) CAPS, Take 1 capsule by mouth daily., Disp: , Rfl:    rosuvastatin (CRESTOR) 20 MG tablet, Take 1 tablet by mouth daily., Disp: , Rfl:  Past Medical History:  Diagnosis Date   Abnormal glucose    Bifascicular block 04/03/2019   Cataract    Dizziness    Dizziness    Hot flashes    HTN (hypertension) 04/03/2019   Hyperlipidemia    Multiple nodules of lung    Nonrheumatic aortic (valve) insufficiency 04/03/2019   Osteopenia    Palpitations    Thyroid  disease    Vitamin D deficiency    Past Surgical History:  Procedure Laterality Date   ABDOMINAL HYSTERECTOMY     APPENDECTOMY  1975   CATARACT EXTRACTION, BILATERAL  2010   CHOLECYSTECTOMY  2003   Family History  Problem Relation Age of Onset   Uterine cancer Mother        died at age 29   Healthy Father        died at age 67   Mental illness Sister    Social History   Socioeconomic History   Marital status: Widowed    Spouse name: Not on file   Number of children: 2   Years of education: college - masters in biology  Highest education level: Master's degree (e.g., MA, MS, MEng, MEd, MSW, MBA)  Occupational History   Occupation: Retired   Tobacco Use   Smoking status: Never   Smokeless tobacco: Never  Vaping Use   Vaping status: Never Used  Substance and Sexual Activity   Alcohol use: Never    Alcohol/week: 0.0 standard drinks of alcohol   Drug use: Never   Sexual activity: Not on file  Other Topics Concern   Not on file  Social History Narrative   Lives alone.   One cup caffeine use.   Right-handed.   Social Drivers of Corporate Investment Banker Strain: Not on file  Food Insecurity: Not on file  Transportation Needs: Not on file  Physical Activity: Not on file  Stress: Not on file  Social Connections: Not on file  Intimate Partner Violence: Not on  file       Objective:  There were no vitals taken for this visit. SpO2 Readings from Last 3 Encounters:  06/14/24 96%  02/09/24 94%  12/14/23 93%    Physical Exam Constitutional:      Appearance: Normal appearance.  HENT:     Head: Normocephalic.     Nose: Nose normal.  Eyes:     Pupils: Pupils are equal, round, and reactive to light.  Cardiovascular:     Rate and Rhythm: Normal rate.  Pulmonary:     Effort: Pulmonary effort is normal.     Breath sounds: Normal breath sounds.  Musculoskeletal:     Comments: No LE edema  Neurological:     General: No focal deficit present.     Mental Status: She is alert and oriented to person, place, and time. Mental status is at baseline.     Diagnostic Review:     Images: 09/2023 CXR normal: Flattening of the diaphragms and moderate hyperinflation, unchanged. Small nodular density within the lateral right mid lung corresponds to a superior segment of the right lower lobe nodule that was decreased in size and 05/01/2023 compared to 01/20/2016 CT and benign. No acute airspace opacity.   04/2023 CT chest IMPRESSION: 1. No acute cardiopulmonary disease. 2. Multiple bilateral pulmonary nodules unchanged compared with the prior examination of 01/20/2016. 3.  Aortic Atherosclerosis   Assessment & Plan:   Chronic cough  89 yo with chronic dry cough since December last year after a flu like symptoms. She denied dyspnea, wheezing, mucus production and acid reflux. CXR and CT chest did not show acute abnormalities, but she had chronic lung nodules present with similar size compare to CT chest in 2017. I was not able to identify the cause of the cough based on history. I will tried a couple of medications to see if this helps the cough. Plan: -Mucinex  bid for 10 days -Tessalon  pearls TID PRN for 10 days -If these therapies don't help, I am planning to start a possible albuterol trial. -Return in 2 months.   No follow-ups on file.     Marny Patch, MD Pulmonary and Critical Care Medicine [facility]

## 2024-08-21 ENCOUNTER — Ambulatory Visit

## 2024-08-21 VITALS — BP 156/73 | HR 68 | Temp 97.9°F | Ht 66.0 in | Wt 127.0 lb

## 2024-08-21 DIAGNOSIS — R053 Chronic cough: Secondary | ICD-10-CM | POA: Diagnosis not present

## 2024-08-21 MED ORDER — BENZONATATE 200 MG PO CAPS: 200.0000 mg | ORAL_CAPSULE | Freq: Three times a day (TID) | ORAL | 0 refills | Status: AC | PRN

## 2024-08-21 NOTE — Patient Instructions (Signed)
 Dear [name];  -I am glad your cough is better. I will send you a refill of Tessalon  pills to use as need it for cough.  -Use Flonase twice a day, a nasal spray for the runny nose.  -Use Mucinex  if you have a lot of chest congestion.   I will see you in 6 months.

## 2024-08-24 DIAGNOSIS — E039 Hypothyroidism, unspecified: Secondary | ICD-10-CM | POA: Diagnosis not present

## 2024-08-24 DIAGNOSIS — I10 Essential (primary) hypertension: Secondary | ICD-10-CM | POA: Diagnosis not present

## 2024-10-05 ENCOUNTER — Emergency Department (HOSPITAL_COMMUNITY)

## 2024-10-05 ENCOUNTER — Encounter (HOSPITAL_COMMUNITY): Payer: Self-pay

## 2024-10-05 ENCOUNTER — Other Ambulatory Visit: Payer: Self-pay

## 2024-10-05 ENCOUNTER — Emergency Department (HOSPITAL_COMMUNITY)
Admission: EM | Admit: 2024-10-05 | Discharge: 2024-10-05 | Disposition: A | Discharge status: Home / Self Care | Attending: Emergency Medicine | Admitting: Emergency Medicine

## 2024-10-05 DIAGNOSIS — Z7982 Long term (current) use of aspirin: Secondary | ICD-10-CM | POA: Insufficient documentation

## 2024-10-05 DIAGNOSIS — G8929 Other chronic pain: Secondary | ICD-10-CM | POA: Insufficient documentation

## 2024-10-05 DIAGNOSIS — H9201 Otalgia, right ear: Secondary | ICD-10-CM | POA: Insufficient documentation

## 2024-10-05 DIAGNOSIS — I10 Essential (primary) hypertension: Secondary | ICD-10-CM | POA: Diagnosis not present

## 2024-10-05 DIAGNOSIS — R519 Headache, unspecified: Secondary | ICD-10-CM | POA: Diagnosis present

## 2024-10-05 DIAGNOSIS — Z79890 Hormone replacement therapy (postmenopausal): Secondary | ICD-10-CM | POA: Diagnosis not present

## 2024-10-05 DIAGNOSIS — E039 Hypothyroidism, unspecified: Secondary | ICD-10-CM | POA: Diagnosis not present

## 2024-10-05 LAB — URINALYSIS, ROUTINE W REFLEX MICROSCOPIC
Bilirubin Urine: NEGATIVE
Glucose, UA: NEGATIVE mg/dL
Hgb urine dipstick: NEGATIVE
Ketones, ur: NEGATIVE mg/dL
Nitrite: NEGATIVE
Protein, ur: NEGATIVE mg/dL
Specific Gravity, Urine: 1.018 (ref 1.005–1.030)
pH: 5 (ref 5.0–8.0)

## 2024-10-05 LAB — CBC WITH DIFFERENTIAL/PLATELET
Abs Immature Granulocytes: 0.01 K/uL (ref 0.00–0.07)
Basophils Absolute: 0 K/uL (ref 0.0–0.1)
Basophils Relative: 1 %
Eosinophils Absolute: 0.1 K/uL (ref 0.0–0.5)
Eosinophils Relative: 2 %
HCT: 41 % (ref 36.0–46.0)
Hemoglobin: 13.2 g/dL (ref 12.0–15.0)
Immature Granulocytes: 0 %
Lymphocytes Relative: 26 %
Lymphs Abs: 1.2 K/uL (ref 0.7–4.0)
MCH: 28.9 pg (ref 26.0–34.0)
MCHC: 32.2 g/dL (ref 30.0–36.0)
MCV: 89.9 fL (ref 80.0–100.0)
Monocytes Absolute: 0.5 K/uL (ref 0.1–1.0)
Monocytes Relative: 11 %
Neutro Abs: 2.9 K/uL (ref 1.7–7.7)
Neutrophils Relative %: 60 %
Platelets: 216 K/uL (ref 150–400)
RBC: 4.56 MIL/uL (ref 3.87–5.11)
RDW: 16.2 % — ABNORMAL HIGH (ref 11.5–15.5)
WBC: 4.8 K/uL (ref 4.0–10.5)
nRBC: 0 % (ref 0.0–0.2)

## 2024-10-05 LAB — COMPREHENSIVE METABOLIC PANEL WITH GFR
ALT: 14 U/L (ref 0–44)
AST: 22 U/L (ref 15–41)
Albumin: 4.3 g/dL (ref 3.5–5.0)
Alkaline Phosphatase: 72 U/L (ref 38–126)
Anion gap: 9 (ref 5–15)
BUN: 16 mg/dL (ref 8–23)
CO2: 26 mmol/L (ref 22–32)
Calcium: 9.7 mg/dL (ref 8.9–10.3)
Chloride: 106 mmol/L (ref 98–111)
Creatinine, Ser: 0.66 mg/dL (ref 0.44–1.00)
GFR, Estimated: >60 mL/min
Glucose, Bld: 111 mg/dL — ABNORMAL HIGH (ref 70–99)
Potassium: 4 mmol/L (ref 3.5–5.1)
Sodium: 141 mmol/L (ref 135–145)
Total Bilirubin: 0.6 mg/dL (ref 0.0–1.2)
Total Protein: 7.4 g/dL (ref 6.5–8.1)

## 2024-10-05 MED ORDER — PROCHLORPERAZINE EDISYLATE 10 MG/2ML IJ SOLN
5.0000 mg | Freq: Once | INTRAMUSCULAR | Status: AC
  Administered 2024-10-05: 5 mg via INTRAVENOUS
  Filled 2024-10-05: qty 2

## 2024-10-05 MED ORDER — DEXAMETHASONE SOD PHOSPHATE PF 10 MG/ML IJ SOLN
10.0000 mg | Freq: Once | INTRAMUSCULAR | Status: AC
  Administered 2024-10-05: 10 mg via INTRAVENOUS
  Filled 2024-10-05: qty 1

## 2024-10-05 MED ORDER — ACETAMINOPHEN 500 MG PO TABS
1000.0000 mg | ORAL_TABLET | Freq: Once | ORAL | Status: AC
  Administered 2024-10-05: 1000 mg via ORAL
  Filled 2024-10-05: qty 2

## 2024-10-05 MED ORDER — LACTATED RINGERS IV BOLUS
500.0000 mL | Freq: Once | INTRAVENOUS | Status: AC
  Administered 2024-10-05: 500 mL via INTRAVENOUS

## 2024-10-05 MED ORDER — IOHEXOL 350 MG/ML SOLN
75.0000 mL | Freq: Once | INTRAVENOUS | Status: AC | PRN
  Administered 2024-10-05: 75 mL via INTRAVENOUS

## 2024-10-05 NOTE — ED Triage Notes (Signed)
 C/O headache and right ear pressure, sinus pressure that started 3 years ago but states it got worse over years. Denies CP/SHOB, blurred vision. Axox4.

## 2024-10-05 NOTE — ED Notes (Signed)
 Cab called for pt.

## 2024-10-05 NOTE — ED Provider Notes (Addendum)
 " [place] EMERGENCY DEPARTMENT AT [facility] Provider Note   CSN: [id number] Arrival date & time: 10/05/24  1203     History No chief complaint on file.   HPI: KRYSTYNE TEWKSBURY is a 89 y.o. female with history pertinent for hyperlipidemia, aortic valve insufficiency, hypertension, hypothyroidism, dizziness, headache who presents complaining of right-sided ear head pain. Patient arrived via POV.  History provided by patient.  No interpreter required during this encounter.  Patient reports that she has had right-sided headache and sensation of ear fullness/pain present over the past 3 to 5 years, since she received COVID vaccinations.  Reports that this has been worsening over the past 2 to 3 months, no specific aggravating or alleviating factors, no recent trauma or falls, denies fever, chills, chest pain, shortness of breath, nausea, vomiting, diarrhea.  Reports that she did see a ENT remotely and did not have any relief from seeing them.  Reports that since her symptoms worsen she did not schedule a repeat visit with her PCP through [facility] physicians, and reports that she was told that she would get a referral to an ENT, however reports that she has not yet heard back on this, therefore she came to the emergency department to get further workup.  Reports that she has not had any changes in her symptoms today or this week that prompted her visit, more so she was tired of her symptoms being consistently present.  Notably, patient denies any dizziness, vertigo to me, though reportedly complained of this in her triage  Patient's recorded medical, surgical, social, medication list and allergies were reviewed in the Snapshot window as part of the initial history.   Prior to Admission medications  Medication Sig Start Date End Date Taking? Authorizing Provider  alendronate (FOSAMAX) 70 MG tablet Take 70 mg by mouth once a week. 10/13/23   [provider]  aspirin EC 81 MG tablet  Take 81 mg by mouth daily. 09/24/23   [provider]  B Complex Vitamins (VITAMIN B COMPLEX) TABS Take 1 tablet by mouth See admin instructions.    [provider]  bimatoprost (LUMIGAN) 0.01 % SOLN Place 1 drop into the right eye at bedtime.    [provider]  Cholecalciferol (VITAMIN D3 PO) Take 1 tablet by mouth daily.    [provider]  gabapentin (NEURONTIN) 100 MG capsule Take 100 mg by mouth 3 (three) times daily.    [provider]  hydrALAZINE  (APRESOLINE ) 25 MG tablet Take 1 tablet (25 mg total) by mouth in the morning and at bedtime. 02/09/24   Ladona Heinz, MD  levobunolol (BETAGAN) 0.5 % ophthalmic solution 1 drop daily. 11/18/23   [provider]  levothyroxine (SYNTHROID) 25 MCG tablet Take 25 mcg by mouth daily before breakfast.     [provider]  Multiple Vitamins-Minerals (PRESERVISION/LUTEIN) CAPS Take 1 capsule by mouth daily. Patient not taking: Reported on 08/21/2024    [provider]  rosuvastatin (CRESTOR) 20 MG tablet Take 1 tablet by mouth daily.    [provider]     Allergies: Other, Penicillins, and Shellfish allergy   Review of Systems   ROS as per HPI  Physical Exam Updated Vital Signs BP (!) 182/91 (BP Location: Left Arm)   Pulse 93   Temp 97.9 F (36.6 C) (Oral)   Resp 18   Ht 5' 6 (1.676 m)   Wt 56.7 kg   SpO2 97%   BMI 20.18 kg/m  Physical  Exam Vitals and nursing note reviewed.  Constitutional:      General: She is not in acute distress.    Appearance: She is well-developed.  HENT:     Head: Normocephalic and atraumatic.     Right Ear: Tympanic membrane normal.     Left Ear: Tympanic membrane normal.     Mouth/Throat:     Mouth: Mucous membranes are moist.     Pharynx: Oropharynx is clear. No oropharyngeal exudate or posterior oropharyngeal erythema.  Eyes:     Conjunctiva/sclera: Conjunctivae normal.  Cardiovascular:     Rate and Rhythm: Normal rate and  regular rhythm.     Heart sounds: No murmur heard. Pulmonary:     Effort: Pulmonary effort is normal. No respiratory distress.     Breath sounds: Normal breath sounds.  Abdominal:     Palpations: Abdomen is soft.     Tenderness: There is no abdominal tenderness.  Musculoskeletal:        General: No swelling.     Cervical back: Neck supple.  Skin:    General: Skin is warm and dry.     Capillary Refill: Capillary refill takes less than 2 seconds.  Neurological:     Mental Status: She is alert and oriented to person, place, and time.     Motor: No weakness.     Gait: Gait normal.  Psychiatric:        Mood and Affect: Mood normal.     ED Course/ Medical Decision Making/ A&P  Procedures Procedures   Medications Ordered in ED Medications  lactated ringers  bolus 500 mL (0 mLs Intravenous Stopped 10/05/24 1911)  acetaminophen  (TYLENOL ) tablet 1,000 mg (1,000 mg Oral Given 10/05/24 1708)  prochlorperazine  (COMPAZINE ) injection 5 mg (5 mg Intravenous Given 10/05/24 1659)  dexamethasone  (DECADRON ) injection 10 mg (10 mg Intravenous Given 10/05/24 1705)  iohexol  (OMNIPAQUE ) 350 MG/ML injection 75 mL (75 mLs Intravenous Contrast Given 10/05/24 1755)    Medical Decision Making:   TARAE WOODEN is a 89 y.o. female who presents for headache and ear pain as per above.  Physical exam is pertinent for no focal abnormalities, clear TMs bilaterally.   The differential includes but is not limited to chronic middle ear effusion, otitis media, Mnire's, subdural, metabolic derangements.  Independent historian: None  External data reviewed: No pertinent external data  Initial Plan:  Screening labs including CBC and Metabolic panel to evaluate for infectious or metabolic etiology of disease.  Urinalysis with reflex culture ordered to evaluate for UTI or relevant urologic/nephrologic pathology.  CTA head to evaluate for structural/vascular intra-cranial pathology.  EKG to evaluate for cardiac  pathology Objective evaluation as below reviewed   Labs: Ordered, Independent interpretation, and Details: CBC without leukocytosis, anemia, thrombocytopenia.  CMP without AKI, emergent electrolyte derangement, emergent LFT abnormality.  UA with small LE, however negative nitrites, no significant WBCs, rare bacteria, overall unconvincing for UTI, there are 6-10 squamous cells, most concerning for contamination.  Radiology: Ordered, Independent interpretation, Details: Personally viewed CT of the head, I do not appreciate ICH, displaced fracture, loss of gray/white matter differentiation, MLS.  CTA without LVO. , and All images reviewed independently.  Agree with radiology report at this time.   CT Angio Head W or Wo Contrast Result Date: 10/05/2024 EXAM: CTA Head without and with Intravenous Contrast CLINICAL HISTORY: Increasing frequency/severity of right-sided headache in 89 year old female. TECHNIQUE: Axial CTA images of the head without and with intravenous contrast. MIP reconstructed images were created and reviewed. Dose  reduction technique was used including one or more of the following: automated exposure control, adjustment of mA and kV according to patient size, and/or iterative reconstruction. CONTRAST: Without and with; 75 mL iohexol  (OMNIPAQUE ) 350 MG/ML injection. COMPARISON: CT head 11/14/2023. FINDINGS: BRAIN PARENCHYMA: Mild to moderate chronic microvascular ischemic changes. Similar appearance of encephalomalacia in the lateral left temporal lobe which could reflect sequelae of remote trauma versus remote infarct. 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 INTERNAL CAROTID ARTERIES: Atherosclerosis of the carotid siphons. Sclerosis of the bilateral carotid siphons. There is mild stenosis of the right cavernous ICA. The intracranial ICAs are patent with no significant stenosis otherwise. No occlusion. No aneurysm. ANTERIOR CEREBRAL ARTERIES: No significant stenosis. No occlusion.  No aneurysm. MIDDLE CEREBRAL ARTERIES: No significant stenosis. No occlusion. No aneurysm. POSTERIOR CEREBRAL ARTERIES: No significant stenosis. No occlusion. No aneurysm. BASILAR ARTERY: No significant stenosis. No occlusion. No aneurysm. VERTEBRAL ARTERIES: Atherosclerosis of the intracranial vertebral arteries. No significant stenosis. No occlusion. No aneurysm. SOFT TISSUES: Bilateral mastoid effusions left greater than right. No masses or lymphadenopathy. BONES: Bilateral lens replacement. No acute osseous abnormality. IMPRESSION: 1. No large vessel occlusion. No aneurysm, dissection, or evidence of vascular malformation. 2. Atherosclerosis of the carotid siphons and intracranial vertebral arteries. 3. Mild stenosis of the right cavernous ICA. 4. Bilateral mastoid effusions, left greater than right. Electronically signed by: Donnice Mania MD 10/05/2024 06:46 PM EST RP Workstation: [id number]    EKG/Medicine tests: Ordered and Independent interpretation EKG Interpretation: Normal sinus rhythm Left axis deviation Right bundle branch block Cannot rule out Inferior infarct , age undetermined Abnormal ECG No acute changes Confirmed by Rogelia Satterfield ([id number]) on 10/05/2024 4:40:35 PM                Interventions: LR bolus, Tylenol , Decadron , Compazine   See the EMR for full details regarding lab and imaging results.  Patient presents for headache and right ear pain/pressure that has been persistent over the past several years, and worsened over the past 2 to 3 months, patient does not have any new or different symptoms, does not have any sick symptoms, has no focal neurologic deficits on exam to raise suspicion for stroke.  Patient primarily expresses frustration that she did not get referrals that she understood that she received, however when discussed risks and benefits of referral versus workup here in the ED to evaluate for emergent pathology such as ICH, patient would like labs, imaging, and trial of  medication, though did discuss that given her symptoms have been ongoing for 5 years, it is possible that we might not resolve her symptoms in the emergency department today.  Patient is amenable to this plan.  Screening labs were obtained in triage and are reassuring, lack of leukocytosis makes infectious/inflammatory etiology less likely, no convincing evidence of UTI or metabolic derangement.  EKG also reassuring.  Will treat headache empirically with multimodal headache cocktail with Tylenol , Decadron , LR bolus, Compazine , as well as CT imaging to evaluate for vascular/intracranial pathology.  With regard to ear pain, there is no evidence of otitis media, otitis externa, middle ear effusion, mastoiditis, nor any abnormality on oropharyngeal exam concerning for referred ear pain.  Imaging reveals no acute abnormality, on reevaluation after medication, patient reports slight improvement in symptoms, however still present, again patient without vertigo.  Given patient has had the symptoms chronically for years, and exam, imaging, labs reassuring, feel that she is stable for continued outpatient management given reassuring workup.  Patient comfortable with plan for  outpatient follow-up, will give referrals to both ENT as well as neurology.  Presentation is most consistent with acute complicated illness and Current presentation is complicated by underlying chronic conditions  Discussion of management or test interpretations with external provider(s): Not indicated  Risk Drugs:OTC drugs and Prescription drug management  Disposition: DISCHARGE: I believe that the patient is safe for discharge home with outpatient follow-up. Patient was informed of all pertinent physical exam, laboratory, and imaging findings. Patient's suspected etiology of their symptom presentation was discussed with the patient and all questions were answered. We discussed following up with PCP, ENT, neurology. I provided thorough ED  return precautions. The patient feels safe and comfortable with this plan.  MDM generated using voice dictation software and may contain dictation errors.  Please contact me for any clarification or with any questions.  Clinical Impression:  1. Chronic intractable headache, unspecified headache type   2. Chronic right ear pain      Discharge   Final Clinical Impression(s) / ED Diagnoses Final diagnoses:  Chronic intractable headache, unspecified headache type  Chronic right ear pain    Rx / DC Orders ED Discharge Orders          Ordered    Ambulatory referral to Neurology       Comments: An appointment is requested in approximately: 1 week   10/05/24 1925    Ambulatory referral to ENT       Comments: Referral to [facility] health ENT   10/05/24 1927             Rogelia Jerilynn RAMAN, MD 10/05/24 GLENDIA    Rogelia Jerilynn RAMAN, MD 10/05/24 1954  "

## 2024-10-05 NOTE — Discharge Instructions (Addendum)
 Ivette JONELLE [place]  Thank you for allowing us  to take care of you today.  You came to the Emergency Department today because you have had a headache and right ear pain for the past several years that has been worse over the past several months.  Here in the emergency department your blood counts and blood salts as well as kidney and liver numbers were normal, you do not have a urine infection.  We are not seeing any bleeding or signs of stroke on your CT of your head or your exam.  You did have slight improvement in your symptoms with medicine here in the emergency department, however you still having symptoms.  It is unclear what exactly is causing your symptoms, however given your exam, imaging, labs are reassuring, you are safe to follow-up outpatient.  We will give you referral to both neurology as well as a ENT doctor.  They should call you sometime early next week to schedule an appointment, if you do not hear from them, you can call them at the numbers listed to help schedule an outpatient appointment.  Additionally you should continue to follow-up with your primary care doctor.  To-Do: 1. Please follow-up with your primary doctor within 1 - 2 weeks / as soon as possible.   Please return to the Emergency Department or call 911 if you experience have worsening of your symptoms, or do not get better, dizziness, difficulty moving or feeling part of your body that usually are able to move or feel, chest pain, shortness of breath, severe or significantly worsening pain, high fever, severe confusion, pass out or have any reason to think that you need emergency medical care.   We hope you feel better soon.   Mitzie Later, MD Department of Emergency Medicine [facility]

## 2024-10-05 NOTE — ED Provider Triage Note (Signed)
 Emergency Medicine Provider Triage Evaluation Note  LUVA METZGER , a 89 y.o. female  was evaluated in triage.  Pt complains of increasing head pressure as well as complaints of episodic vertigo.  No previous history of peripheral vertigo, no notice of diagnosis of Mnire's, lab arthritis, or any other ENT diagnoses.  She did have a recent otitis media, though examination of the ears today does not show any new OM.  Any drainage from the ears.  Review of Systems  Positive: As above Negative:   Physical Exam  BP (!) 156/74 (BP Location: Right Arm)   Pulse 100   Temp 98 F (36.7 C)   Resp 15   Ht 5' 6 (1.676 m)   Wt 56.7 kg   SpO2 100%   BMI 20.18 kg/m  Gen:   Awake, no distress   Resp:  Normal effort  MSK:   Moves extremities without difficulty  Other:  Bilateral TMs normal, there is a small abrasion to the external canal on the right, ambulatory without assistance, normal gait, abnormal tandem gait likely due to age.  Medical Decision Making  Medically screening exam initiated at 1:14 PM.  Appropriate orders placed.  FIDELIS LOTH was informed that the remainder of the evaluation will be completed by another provider, this initial triage assessment does not replace that evaluation, and the importance of remaining in the ED until their evaluation is complete.  Orders is placed since patient has not had any labs or EKG in the last year.   Myriam Dorn BROCKS, [place] 10/05/24 1316

## 2025-03-01 ENCOUNTER — Ambulatory Visit: Admitting: Neurology
